# Patient Record
Sex: Female | Born: 1995 | Race: Black or African American | Hispanic: No | Marital: Single | Smoking: Never smoker
Health system: Southern US, Community
[De-identification: ages and names within clinical notes are randomized; demographics above are authoritative.]

## PROBLEM LIST (undated history)

## (undated) DIAGNOSIS — Z789 Other specified health status: Secondary | ICD-10-CM

## (undated) HISTORY — PX: NO PAST SURGERIES: SHX2092

---
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
## 2016-01-28 NOTE — L&D Delivery Note (Signed)
21 y.o. G1P0 at 4123w4d delivered a viable female infant in cephalic, LOA position. no nuchal cord. anterior shoulder delivered with ease. 60 sec delayed cord clamping. Cord clamped x2 and cut. Placenta delivered spontaneously intact, with 3VC. Fundus firm on exam with massage and pitocin. Good hemostasis noted.  Anesthesia: Epidural Laceration: None Suture: No sutures required Good hemostasis noted. EBL: 100 cc  Mom and baby recovering in LDR.    Weight: Pending skin to skin  Sponge and instrument count were correct x2. Placenta sent to L&D.  Howard PouchLauren Feng, MD PGY-1 Family Medicine 06/16/2016, 8:23 PM   OB FELLOW DELIVERY ATTESTATION  I was gloved and present for the delivery in its entirety, and I agree with the above resident's note.    Ernestina PennaNicholas Schenk, MD 8:25 PM

## 2016-03-03 ENCOUNTER — Emergency Department (HOSPITAL_COMMUNITY): Payer: Medicaid Other

## 2016-03-03 ENCOUNTER — Emergency Department (HOSPITAL_COMMUNITY)
Admission: EM | Admit: 2016-03-03 | Discharge: 2016-03-04 | Disposition: A | Discharge status: Home / Self Care | Payer: Medicaid Other | Attending: Emergency Medicine | Admitting: Emergency Medicine

## 2016-03-03 ENCOUNTER — Encounter (HOSPITAL_COMMUNITY): Payer: Self-pay | Admitting: *Deleted

## 2016-03-03 DIAGNOSIS — O368190 Decreased fetal movements, unspecified trimester, not applicable or unspecified: Secondary | ICD-10-CM

## 2016-03-03 DIAGNOSIS — R109 Unspecified abdominal pain: Secondary | ICD-10-CM

## 2016-03-03 DIAGNOSIS — O26892 Other specified pregnancy related conditions, second trimester: Secondary | ICD-10-CM | POA: Diagnosis present

## 2016-03-03 DIAGNOSIS — Z3A26 26 weeks gestation of pregnancy: Secondary | ICD-10-CM | POA: Diagnosis not present

## 2016-03-03 DIAGNOSIS — N898 Other specified noninflammatory disorders of vagina: Secondary | ICD-10-CM | POA: Diagnosis not present

## 2016-03-03 DIAGNOSIS — O26893 Other specified pregnancy related conditions, third trimester: Secondary | ICD-10-CM

## 2016-03-03 LAB — COMPREHENSIVE METABOLIC PANEL
ALBUMIN: 3.4 g/dL — AB (ref 3.5–5.0)
ALK PHOS: 69 U/L (ref 38–126)
ALT: 19 U/L (ref 14–54)
AST: 22 U/L (ref 15–41)
Anion gap: 8 (ref 5–15)
BILIRUBIN TOTAL: 0.5 mg/dL (ref 0.3–1.2)
BUN: 5 mg/dL — AB (ref 6–20)
CO2: 23 mmol/L (ref 22–32)
CREATININE: 0.61 mg/dL (ref 0.44–1.00)
Calcium: 9.4 mg/dL (ref 8.9–10.3)
Chloride: 107 mmol/L (ref 101–111)
GFR calc Af Amer: >60 mL/min (ref >60)
GFR calc non Af Amer: >60 mL/min (ref >60)
GLUCOSE: 103 mg/dL — AB (ref 65–99)
Potassium: 3.7 mmol/L (ref 3.5–5.1)
Sodium: 138 mmol/L (ref 135–145)
TOTAL PROTEIN: 6.8 g/dL (ref 6.5–8.1)

## 2016-03-03 LAB — CBC WITH DIFFERENTIAL/PLATELET
BASOS ABS: 0 10*3/uL (ref 0.0–0.1)
BASOS PCT: 0 %
EOS PCT: 1 %
Eosinophils Absolute: 0.1 10*3/uL (ref 0.0–0.7)
HCT: 34 % — ABNORMAL LOW (ref 36.0–46.0)
Hemoglobin: 11.1 g/dL — ABNORMAL LOW (ref 12.0–15.0)
Lymphocytes Relative: 17 %
Lymphs Abs: 2.5 10*3/uL (ref 0.7–4.0)
MCH: 28 pg (ref 26.0–34.0)
MCHC: 32.6 g/dL (ref 30.0–36.0)
MCV: 85.9 fL (ref 78.0–100.0)
MONO ABS: 0.6 10*3/uL (ref 0.1–1.0)
Monocytes Relative: 4 %
Neutro Abs: 11.5 10*3/uL — ABNORMAL HIGH (ref 1.7–7.7)
Neutrophils Relative %: 78 %
PLATELETS: 439 10*3/uL — AB (ref 150–400)
RBC: 3.96 MIL/uL (ref 3.87–5.11)
RDW: 15.1 % (ref 11.5–15.5)
WBC: 14.7 10*3/uL — ABNORMAL HIGH (ref 4.0–10.5)

## 2016-03-03 LAB — ABO/RH: ABO/RH(D): O POS

## 2016-03-03 LAB — HCG, QUANTITATIVE, PREGNANCY: hCG, Beta Chain, Quant, S: 5664 m[IU]/mL — ABNORMAL HIGH (ref <5)

## 2016-03-03 NOTE — ED Triage Notes (Signed)
Pt reports not seeing MD since she found out she is pregnant,  Pt reports not feeling the baby move in 1 wk, pt LMP 08/31/2016, pt denies vaginal bleeding, pt denies cramping, denies n/v/d, pt A&O x4

## 2016-03-03 NOTE — Progress Notes (Signed)
[facility] to see this patient. She is a G1P0, who presented with no prenatal care, self estimated LMP 08/28/15. She is complaining of some abdominal pain, states she discussed her problems of no prenatal care earlier this evening with nurse on "help line", who recommended she seek immediate care at ED.   2345-Fetal heart tones 155bpm, assessing for contractions. Patient in no appearent distress.

## 2016-03-03 NOTE — ED Provider Notes (Signed)
[facility]-EMERGENCY DEPT Provider Note   CSN: [id number] Arrival date & time: 03/03/16  1538  By signing my name below, I, Talbert Nan, attest that this documentation has been prepared under the direction and in the presence of Kerrie Buffalo, NP. Electronically Signed: Talbert Nan, Scribe. 03/03/16. 11:53 PM.   History   Chief Complaint Chief Complaint  Patient presents with  . Abdominal Pain    HPI Christina Mann is a 21 y.o. female G1P0 who presents to the Emergency Department after talking to a nurse on the phone and discussing prenatal treatment.  She reports that it has been about a week since she felt fetal movement. She has not see an OBGYN. Pt reports vaginal discharge. LMP was 08/28/15. Pt reports nausea, limited appetite, cramping.   The history is provided by the patient. No language interpreter was used.    No past medical history on file.  There are no active problems to display for this patient.   No past surgical history on file.  OB History    Gravida Para Term Preterm AB Living   1             SAB TAB Ectopic Multiple Live Births                   Home Medications    Prior to Admission medications   Not on File    Family History No family history on file.  Social History Social History  Substance Use Topics  . Smoking status: Never Smoker  . Smokeless tobacco: Not on file  . Alcohol use Not on file     Allergies   Patient has no known allergies.   Review of Systems Review of Systems  Constitutional: Positive for appetite change. Negative for chills and fever.  Respiratory: Negative for shortness of breath.   Gastrointestinal: Positive for abdominal pain and nausea. Negative for diarrhea.  Genitourinary: Positive for vaginal discharge.  Musculoskeletal:       Cramping.  Skin: Negative for rash.  Neurological: Negative for syncope.     Physical Exam Updated Vital Signs BP 120/69 (BP Location: Right Arm)   Pulse 90   Temp 98.1 F  (36.7 C) (Oral)   Resp 16   SpO2 100%   Physical Exam  Constitutional: She is oriented to person, place, and time. She appears well-developed and well-nourished. No distress.  HENT:  Head: Normocephalic and atraumatic.  Eyes: EOM are normal.  Neck: Neck supple.  Cardiovascular: Normal rate and regular rhythm.   Pulmonary/Chest: Effort normal and breath sounds normal.  Abdominal: Soft. Bowel sounds are normal.  gravid  Genitourinary:  Genitourinary Comments: External genitalia without lesions, white d/c vaginal vault, cervix closed, thick and high, uterus approximately 26 week size.   Musculoskeletal: Normal range of motion.  Neurological: She is alert and oriented to person, place, and time.  Skin: Skin is warm and dry.  Psychiatric: She has a normal mood and affect. Her behavior is normal.  Nursing note and vitals reviewed.    ED Treatments / Results   DIAGNOSTIC STUDIES: Oxygen Saturation is 99% on room air, normal by my interpretation.    COORDINATION OF CARE: 10:44 PM Discussed treatment plan with pt at bedside and pt agreed to plan  11:23 PM Prenatal labs,appointment with [facility] in order to start prenatal care.  Labs (all labs ordered are listed, but only abnormal results are displayed) Labs Reviewed  WET PREP, GENITAL - Abnormal; Notable for the following:  Result Value   Clue Cells Wet Prep HPF POC PRESENT (*)    WBC, Wet Prep HPF POC FEW (*)    All other components within normal limits  HCG, QUANTITATIVE, PREGNANCY - Abnormal; Notable for the following:    hCG, Beta Chain, Quant, S 5,664 (*)    All other components within normal limits  COMPREHENSIVE METABOLIC PANEL - Abnormal; Notable for the following:    Glucose, Bld 103 (*)    BUN 5 (*)    Albumin 3.4 (*)    All other components within normal limits  CBC WITH DIFFERENTIAL/PLATELET - Abnormal; Notable for the following:    WBC 14.7 (*)    Hemoglobin 11.1 (*)    HCT 34.0 (*)    Platelets  439 (*)    Neutro Abs 11.5 (*)    All other components within normal limits  CULTURE, BETA STREP (GROUP B ONLY)  RPR  HIV ANTIBODY (ROUTINE TESTING)  HEPATITIS B SURFACE ANTIGEN  ABO/RH  GC/CHLAMYDIA PROBE AMP ([place]) NOT AT [facility]   Radiology Ultrasound showed a 26 week 2 day IUP with cardiac activity.  External fetal monitory applied, rapid response RN here to see the patient and review monitor tracing.  Consult with Dr. Erin FullingHarraway-Smith and arrangements made [facility].   Procedures Procedures (including critical care time)  Medications Ordered in ED Medications - No data to display   Initial Impression / Assessment and Plan / ED Course  I have reviewed the triage vital signs and the nursing notes.  Pertinent labs & imaging results that were available during my care of the patient were reviewed by me and considered in my medical decision making (see chart for details).   Final Clinical Impressions(s) / ED Diagnoses  21 y.o. female stable for d/c without acute abdomen and appears comfortable.  Final diagnoses:  Abdominal pain in pregnancy, third trimester    New Prescriptions Discharge Medication List as of 03/04/2016 12:35 AM     I personally performed the services described in this documentation, which was scribed in my presence. The recorded information has been reviewed and is accurate.     [address] DriveHope Miller CityM Khoury Siemon, [place]NP 03/07/16 [phone]    Mancel BaleElliott Wentz, MD 03/07/16 [phone]

## 2016-03-03 NOTE — ED Notes (Signed)
Patient ambulated to the room independently no acute distress noted.

## 2016-03-03 NOTE — ED Notes (Signed)
Corlis LeakMackuen, MD at triage, labs ordered on pt, MD aware of pts complaint, plan of care to have labs ordered & US completed

## 2016-03-04 LAB — WET PREP, GENITAL
SPERM: NONE SEEN
Trich, Wet Prep: NONE SEEN
Yeast Wet Prep HPF POC: NONE SEEN

## 2016-03-04 LAB — GC/CHLAMYDIA PROBE AMP (~~LOC~~) NOT AT ARMC
CHLAMYDIA, DNA PROBE: NEGATIVE
NEISSERIA GONORRHEA: NEGATIVE

## 2016-03-04 LAB — HIV ANTIBODY (ROUTINE TESTING W REFLEX): HIV Screen 4th Generation wRfx: NONREACTIVE

## 2016-03-04 LAB — RPR: RPR: NONREACTIVE

## 2016-03-04 NOTE — Discharge Instructions (Signed)
Take tylenol as needed for pain. [facility] will call [facility] to schedule your follow up appointment.

## 2016-03-04 NOTE — ED Notes (Signed)
Delay in lab draw,  Provider examining pt at this time

## 2016-03-05 LAB — HEPATITIS B SURFACE ANTIGEN: Hepatitis B Surface Ag: NEGATIVE

## 2016-03-11 ENCOUNTER — Encounter: Payer: Medicaid Other | Admitting: Student

## 2016-03-13 ENCOUNTER — Other Ambulatory Visit: Payer: Self-pay | Admitting: Obstetrics and Gynecology

## 2016-03-13 ENCOUNTER — Ambulatory Visit (INDEPENDENT_AMBULATORY_CARE_PROVIDER_SITE_OTHER): Payer: Medicaid Other | Admitting: Obstetrics and Gynecology

## 2016-03-13 DIAGNOSIS — O0933 Supervision of pregnancy with insufficient antenatal care, third trimester: Secondary | ICD-10-CM | POA: Insufficient documentation

## 2016-03-13 DIAGNOSIS — Z3400 Encounter for supervision of normal first pregnancy, unspecified trimester: Secondary | ICD-10-CM | POA: Insufficient documentation

## 2016-03-13 NOTE — Progress Notes (Signed)
  Subjective:    Christina Mann is a G1P0 4466w0d being seen today for her first obstetrical visit.  Her obstetrical history is significant for late onset to care at 27 weeks. Patient does not intend to breast feed. Pregnancy history fully reviewed.  Patient reports no complaints. She plans to give the baby up for adoption and is working with an agency  Vitals:   03/13/16 1001  BP: (!) 105/57  Pulse: (!) 107    HISTORY: OB History  Gravida Para Term Preterm AB Living  1            SAB TAB Ectopic Multiple Live Births               # Outcome Date GA Lbr Len/2nd Weight Sex Delivery Anes PTL Lv  1 Current              No past medical history on file. No past surgical history on file. No family history on file.   Exam    Uterus:   fundal height 27 cm  Pelvic Exam:    Perineum: No Hemorrhoids, Normal Perineum   Vulva: normal   Vagina:  normal mucosa, normal discharge   pH:    Cervix: nulliparous appearance and cervix is closed and long   Adnexa: not evaluated   Bony Pelvis: gynecoid  System: Breast:  normal appearance, no masses or tenderness   Skin: normal coloration and turgor, no rashes    Neurologic: oriented, no focal deficits   Extremities: normal strength, tone, and muscle mass   HEENT extra ocular movement intact   Mouth/Teeth mucous membranes moist, pharynx normal without lesions and dental hygiene good   Neck supple and no masses   Cardiovascular: regular rate and rhythm   Respiratory:  chest clear, no wheezing, crepitations, rhonchi, normal symmetric air entry   Abdomen: soft, non-tender; bowel sounds normal; no masses,  no organomegaly   Urinary:       Assessment:    Pregnancy: G1P0 Patient Active Problem List   Diagnosis Date Noted  . Supervision of normal first pregnancy, antepartum 03/13/2016  . Insufficient prenatal care, third trimester 03/13/2016        Plan:     Initial labs drawn. Prenatal vitamins. Problem list reviewed and  updated. Genetic Screening discussed : too late.  Ultrasound discussed; fetal survey: ordered. Patient declined flu and Tdap vaccine Patient will return before her next appointment for glucola  Follow up in 2 weeks. 50% of 30 min visit spent on counseling and coordination of care.     Katie Moch 03/13/2016

## 2016-03-13 NOTE — Patient Instructions (Addendum)
Third Trimester of Pregnancy The third trimester is from week 29 through week 40 (months 7 through 9). The third trimester is a time when the unborn baby (fetus) is growing rapidly. At the end of the ninth month, the fetus is about 20 inches in length and weighs 6-10 pounds. Body changes during your third trimester Your body goes through many changes during pregnancy. The changes vary from woman to woman. During the third trimester:  Your weight will continue to increase. You can expect to gain 25-35 pounds (11-16 kg) by the end of the pregnancy.  You may begin to get stretch marks on your hips, abdomen, and breasts.  You may urinate more often because the fetus is moving lower into your pelvis and pressing on your bladder.  You may develop or continue to have heartburn. This is caused by increased hormones that slow down muscles in the digestive tract.  You may develop or continue to have constipation because increased hormones slow digestion and cause the muscles that push waste through your intestines to relax.  You may develop hemorrhoids. These are swollen veins (varicose veins) in the rectum that can itch or be painful.  You may develop swollen, bulging veins (varicose veins) in your legs.  You may have increased body aches in the pelvis, back, or thighs. This is due to weight gain and increased hormones that are relaxing your joints.  You may have changes in your hair. These can include thickening of your hair, rapid growth, and changes in texture. Some women also have hair loss during or after pregnancy, or hair that feels dry or thin. Your hair will most likely return to normal after your baby is born.  Your breasts will continue to grow and they will continue to become tender. A yellow fluid (colostrum) may leak from your breasts. This is the first milk you are producing for your baby.  Your belly button may stick out.  You may notice more swelling in your hands, face, or  ankles.  You may have increased tingling or numbness in your hands, arms, and legs. The skin on your belly may also feel numb.  You may feel short of breath because of your expanding uterus.  You may have more problems sleeping. This can be caused by the size of your belly, increased need to urinate, and an increase in your body's metabolism.  You may notice the fetus "dropping," or moving lower in your abdomen.  You may have increased vaginal discharge.  Your cervix becomes thin and soft (effaced) near your due date. What to expect at prenatal visits You will have prenatal exams every 2 weeks until week 36. Then you will have weekly prenatal exams. During a routine prenatal visit:  You will be weighed to make sure you and the fetus are growing normally.  Your blood pressure will be taken.  Your abdomen will be measured to track your baby's growth.  The fetal heartbeat will be listened to.  Any test results from the previous visit will be discussed.  You may have a cervical check near your due date to see if you have effaced. At around 36 weeks, your health care provider will check your cervix. At the same time, your health care provider will also perform a test on the secretions of the vaginal tissue. This test is to determine if a type of bacteria, Group B streptococcus, is present. Your health care provider will explain this further. Your health care provider may ask you:    What your birth plan is.  How you are feeling.  If you are feeling the baby move.  If you have had any abnormal symptoms, such as leaking fluid, bleeding, severe headaches, or abdominal cramping.  If you are using any tobacco products, including cigarettes, chewing tobacco, and electronic cigarettes.  If you have any questions. Other tests or screenings that may be performed during your third trimester include:  Blood tests that check for low iron levels (anemia).  Fetal testing to check the health,  activity level, and growth of the fetus. Testing is done if you have certain medical conditions or if there are problems during the pregnancy.  Nonstress test (NST). This test checks the health of your baby to make sure there are no signs of problems, such as the baby not getting enough oxygen. During this test, a belt is placed around your belly. The baby is made to move, and its heart rate is monitored during movement. What is false labor? False labor is a condition in which you feel small, irregular tightenings of the muscles in the womb (contractions) that eventually go away. These are called Braxton Hicks contractions. Contractions may last for hours, days, or even weeks before true labor sets in. If contractions come at regular intervals, become more frequent, increase in intensity, or become painful, you should see your health care provider. What are the signs of labor?  Abdominal cramps.  Regular contractions that start at 10 minutes apart and become stronger and more frequent with time.  Contractions that start on the top of the uterus and spread down to the lower abdomen and back.  Increased pelvic pressure and dull back pain.  A watery or bloody mucus discharge that comes from the vagina.  Leaking of amniotic fluid. This is also known as your "water breaking." It could be a slow trickle or a gush. Let your doctor know if it has a color or strange odor. If you have any of these signs, call your health care provider right away, even if it is before your due date. Follow these instructions at home: Eating and drinking  Continue to eat regular, healthy meals.  Do not eat:  Raw meat or meat spreads.  Unpasteurized milk or cheese.  Unpasteurized juice.  Store-made salad.  Refrigerated smoked seafood.  Hot dogs or deli meat, unless they are piping hot.  More than 6 ounces of albacore tuna a week.  Shark, swordfish, king mackerel, or tile fish.  Store-made salads.  Raw  sprouts, such as mung bean or alfalfa sprouts.  Take prenatal vitamins as told by your health care provider.  Take 1000 mg of calcium daily as told by your health care provider.  If you develop constipation:  Take over-the-counter or prescription medicines.  Drink enough fluid to keep your urine clear or pale yellow.  Eat foods that are high in fiber, such as fresh fruits and vegetables, whole grains, and beans.  Limit foods that are high in fat and processed sugars, such as fried and sweet foods. Activity  Exercise only as directed by your health care provider. Healthy pregnant women should aim for 2 hours and 30 minutes of moderate exercise per week. If you experience any pain or discomfort while exercising, stop.  Avoid heavy lifting.  Do not exercise in extreme heat or humidity, or at high altitudes.  Wear low-heel, comfortable shoes.  Practice good posture.  Do not travel far distances unless it is absolutely necessary and only with the approval   of your health care provider.  Wear your seat belt at all times while in a car, on a bus, or on a plane.  Take frequent breaks and rest with your legs elevated if you have leg cramps or low back pain.  Do not use hot tubs, steam rooms, or saunas.  You may continue to have sex unless your health care provider tells you otherwise. Lifestyle  Do not use any products that contain nicotine or tobacco, such as cigarettes and e-cigarettes. If you need help quitting, ask your health care provider.  Do not drink alcohol.  Do not use any medicinal herbs or unprescribed drugs. These chemicals affect the formation and growth of the baby.  If you develop varicose veins:  Wear support pantyhose or compression stockings as told by your healthcare provider.  Elevate your feet for 15 minutes, 3-4 times a day.  Wear a supportive maternity bra to help with breast tenderness. General instructions  Take over-the-counter and prescription  medicines only as told by your health care provider. There are medicines that are either safe or unsafe to take during pregnancy.  Take warm sitz baths to soothe any pain or discomfort caused by hemorrhoids. Use hemorrhoid cream or witch hazel if your health care provider approves.  Avoid cat litter boxes and soil used by cats. These carry germs that can cause birth defects in the baby. If you have a cat, ask someone to clean the litter box for you.  To prepare for the arrival of your baby:  Take prenatal classes to understand, practice, and ask questions about the labor and delivery.  Make a trial run to the hospital.  Visit the hospital and tour the maternity area.  Arrange for maternity or paternity leave through employers.  Arrange for family and friends to take care of pets while you are in the hospital.  Purchase a rear-facing car seat and make sure you know how to install it in your car.  Pack your hospital bag.  Prepare the baby's nursery. Make sure to remove all pillows and stuffed animals from the baby's crib to prevent suffocation.  Visit your dentist if you have not gone during your pregnancy. Use a soft toothbrush to brush your teeth and be gentle when you floss.  Keep all prenatal follow-up visits as told by your health care provider. This is important. Contact a health care provider if:  You are unsure if you are in labor or if your water has broken.  You become dizzy.  You have mild pelvic cramps, pelvic pressure, or nagging pain in your abdominal area.  You have lower back pain.  You have persistent nausea, vomiting, or diarrhea.  You have an unusual or bad smelling vaginal discharge.  You have pain when you urinate. Get help right away if:  You have a fever.  You are leaking fluid from your vagina.  You have spotting or bleeding from your vagina.  You have severe abdominal pain or cramping.  You have rapid weight loss or weight gain.  You have  shortness of breath with chest pain.  You notice sudden or extreme swelling of your face, hands, ankles, feet, or legs.  Your baby makes fewer than 10 movements in 2 hours.  You have severe headaches that do not go away with medicine.  You have vision changes. Summary  The third trimester is from week 29 through week 40, months 7 through 9. The third trimester is a time when the unborn baby (fetus)   is growing rapidly.  During the third trimester, your discomfort may increase as you and your baby continue to gain weight. You may have abdominal, leg, and back pain, sleeping problems, and an increased need to urinate.  During the third trimester your breasts will keep growing and they will continue to become tender. A yellow fluid (colostrum) may leak from your breasts. This is the first milk you are producing for your baby.  False labor is a condition in which you feel small, irregular tightenings of the muscles in the womb (contractions) that eventually go away. These are called Braxton Hicks contractions. Contractions may last for hours, days, or even weeks before true labor sets in.  Signs of labor can include: abdominal cramps; regular contractions that start at 10 minutes apart and become stronger and more frequent with time; watery or bloody mucus discharge that comes from the vagina; increased pelvic pressure and dull back pain; and leaking of amniotic fluid. This information is not intended to replace advice given to you by your health care provider. Make sure you discuss any questions you have with your health care provider. Document Released: 01/07/2001 Document Revised: 06/21/2015 Document Reviewed: 03/16/2012 Else[employer] Interactive Patient Education  2017 [employer]  Contraception Choices Contraception (birth control) is the use of any methods or devices to prevent pregnancy. Below are some methods to help avoid pregnancy. Hormonal methods  Contraceptive implant. This is a  thin, plastic tube containing progesterone hormone. It does not contain estrogen hormone. Your health care provider inserts the tube in the inner part of the upper arm. The tube can remain in place for up to 3 years. After 3 years, the implant must be removed. The implant prevents the ovaries from releasing an egg (ovulation), thickens the cervical mucus to prevent sperm from entering the uterus, and thins the lining of the inside of the uterus.  Progesterone-only injections. These injections are given every 3 months by your health care provider to prevent pregnancy. This synthetic progesterone hormone stops the ovaries from releasing eggs. It also thickens cervical mucus and changes the uterine lining. This makes it harder for sperm to survive in the uterus.  Birth control pills. These pills contain estrogen and progesterone hormone. They work by preventing the ovaries from releasing eggs (ovulation). They also cause the cervical mucus to thicken, preventing the sperm from entering the uterus. Birth control pills are prescribed by a health care provider.Birth control pills can also be used to treat heavy periods.  Minipill. This type of birth control pill contains only the progesterone hormone. They are taken every day of each month and must be prescribed by your health care provider.  Birth control patch. The patch contains hormones similar to those in birth control pills. It must be changed once a week and is prescribed by a health care provider.  Vaginal ring. The ring contains hormones similar to those in birth control pills. It is left in the vagina for 3 weeks, removed for 1 week, and then a new one is put back in place. The patient must be comfortable inserting and removing the ring from the vagina.A health care provider's prescription is necessary.  Emergency contraception. Emergency contraceptives prevent pregnancy after unprotected sexual intercourse. This pill can be taken right after sex  or up to 5 days after unprotected sex. It is most effective the sooner you take the pills after having sexual intercourse. Most emergency contraceptive pills are available without a prescription. Check with your pharmacist. Do not use   emergency contraception as your only form of birth control. Barrier methods  Female condom. This is a thin sheath (latex or rubber) that is worn over the penis during sexual intercourse. It can be used with spermicide to increase effectiveness.  Female condom. This is a soft, loose-fitting sheath that is put into the vagina before sexual intercourse.  Diaphragm. This is a soft, latex, dome-shaped barrier that must be fitted by a health care provider. It is inserted into the vagina, along with a spermicidal jelly. It is inserted before intercourse. The diaphragm should be left in the vagina for 6 to 8 hours after intercourse.  Cervical cap. This is a round, soft, latex or plastic cup that fits over the cervix and must be fitted by a health care provider. The cap can be left in place for up to 48 hours after intercourse.  Sponge. This is a soft, circular piece of polyurethane foam. The sponge has spermicide in it. It is inserted into the vagina after wetting it and before sexual intercourse.  Spermicides. These are chemicals that kill or block sperm from entering the cervix and uterus. They come in the form of creams, jellies, suppositories, foam, or tablets. They do not require a prescription. They are inserted into the vagina with an applicator before having sexual intercourse. The process must be repeated every time you have sexual intercourse. Intrauterine contraception  Intrauterine device (IUD). This is a T-shaped device that is put in a woman's uterus during a menstrual period to prevent pregnancy. There are 2 types:  Copper IUD. This type of IUD is wrapped in copper wire and is placed inside the uterus. Copper makes the uterus and fallopian tubes produce a fluid  that kills sperm. It can stay in place for 10 years.  Hormone IUD. This type of IUD contains the hormone progestin (synthetic progesterone). The hormone thickens the cervical mucus and prevents sperm from entering the uterus, and it also thins the uterine lining to prevent implantation of a fertilized egg. The hormone can weaken or kill the sperm that get into the uterus. It can stay in place for 3-5 years, depending on which type of IUD is used. Permanent methods of contraception  Female tubal ligation. This is when the woman's fallopian tubes are surgically sealed, tied, or blocked to prevent the egg from traveling to the uterus.  Hysteroscopic sterilization. This involves placing a small coil or insert into each fallopian tube. Your doctor uses a technique called hysteroscopy to do the procedure. The device causes scar tissue to form. This results in permanent blockage of the fallopian tubes, so the sperm cannot fertilize the egg. It takes about 3 months after the procedure for the tubes to become blocked. You must use another form of birth control for these 3 months.  Female sterilization. This is when the female has the tubes that carry sperm tied off (vasectomy).This blocks sperm from entering the vagina during sexual intercourse. After the procedure, the man can still ejaculate fluid (semen). Natural planning methods  Natural family planning. This is not having sexual intercourse or using a barrier method (condom, diaphragm, cervical cap) on days the woman could become pregnant.  Calendar method. This is keeping track of the length of each menstrual cycle and identifying when you are fertile.  Ovulation method. This is avoiding sexual intercourse during ovulation.  Symptothermal method. This is avoiding sexual intercourse during ovulation, using a thermometer and ovulation symptoms.  Post-ovulation method. This is timing sexual intercourse after you have   ovulated. Regardless of which type or  method of contraception you choose, it is important that you use condoms to protect against the transmission of sexually transmitted infections (STIs). Talk with your health care provider about which form of contraception is most appropriate for you. This information is not intended to replace advice given to you by your health care provider. Make sure you discuss any questions you have with your health care provider. Document Released: 01/13/2005 Document Revised: 06/21/2015 Document Reviewed: 07/08/2012 Else[employer] Interactive Patient Education  2017 [employer]   Breastfeeding Deciding to breastfeed is one of the best choices you can make for you and your baby. A change in hormones during pregnancy causes your breast tissue to grow and increases the number and size of your milk ducts. These hormones also allow proteins, sugars, and fats from your blood supply to make breast milk in your milk-producing glands. Hormones prevent breast milk from being released before your baby is born as well as prompt milk flow after birth. Once breastfeeding has begun, thoughts of your baby, as well as his or her sucking or crying, can stimulate the release of milk from your milk-producing glands. Benefits of breastfeeding For Your Baby  Your first milk (colostrum) helps your baby's digestive system function better.  There are antibodies in your milk that help your baby fight off infections.  Your baby has a lower incidence of asthma, allergies, and sudden infant death syndrome.  The nutrients in breast milk are better for your baby than infant formulas and are designed uniquely for your baby's needs.  Breast milk improves your baby's brain development.  Your baby is less likely to develop other conditions, such as childhood obesity, asthma, or type 2 diabetes mellitus. For You  Breastfeeding helps to create a very special bond between you and your baby.  Breastfeeding is convenient. Breast milk is always  available at the correct temperature and costs nothing.  Breastfeeding helps to burn calories and helps you lose the weight gained during pregnancy.  Breastfeeding makes your uterus contract to its prepregnancy size faster and slows bleeding (lochia) after you give birth.  Breastfeeding helps to lower your risk of developing type 2 diabetes mellitus, osteoporosis, and breast or ovarian cancer later in life. Signs that your baby is hungry Early Signs of Hunger  Increased alertness or activity.  Stretching.  Movement of the head from side to side.  Movement of the head and opening of the mouth when the corner of the mouth or cheek is stroked (rooting).  Increased sucking sounds, smacking lips, cooing, sighing, or squeaking.  Hand-to-mouth movements.  Increased sucking of fingers or hands. Late Signs of Hunger  Fussing.  Intermittent crying. Extreme Signs of Hunger  Signs of extreme hunger will require calming and consoling before your baby will be able to breastfeed successfully. Do not wait for the following signs of extreme hunger to occur before you initiate breastfeeding:  Restlessness.  A loud, strong cry.  Screaming. Breastfeeding basics  Breastfeeding Initiation  Find a comfortable place to sit or lie down, with your neck and back well supported.  Place a pillow or rolled up blanket under your baby to bring him or her to the level of your breast (if you are seated). Nursing pillows are specially designed to help support your arms and your baby while you breastfeed.  Make sure that your baby's abdomen is facing your abdomen.  Gently massage your breast. With your fingertips, massage from your chest wall toward your   nipple in a circular motion. This encourages milk flow. You may need to continue this action during the feeding if your milk flows slowly.  Support your breast with 4 fingers underneath and your thumb above your nipple. Make sure your fingers are well  away from your nipple and your baby's mouth.  Stroke your baby's lips gently with your finger or nipple.  When your baby's mouth is open wide enough, quickly bring your baby to your breast, placing your entire nipple and as much of the colored area around your nipple (areola) as possible into your baby's mouth.  More areola should be visible above your baby's upper lip than below the lower lip.  Your baby's tongue should be between his or her lower gum and your breast.  Ensure that your baby's mouth is correctly positioned around your nipple (latched). Your baby's lips should create a seal on your breast and be turned out (everted).  It is common for your baby to suck about 2-3 minutes in order to start the flow of breast milk. Latching  Teaching your baby how to latch on to your breast properly is very important. An improper latch can cause nipple pain and decreased milk supply for you and poor weight gain in your baby. Also, if your baby is not latched onto your nipple properly, he or she may swallow some air during feeding. This can make your baby fussy. Burping your baby when you switch breasts during the feeding can help to get rid of the air. However, teaching your baby to latch on properly is still the best way to prevent fussiness from swallowing air while breastfeeding. Signs that your baby has successfully latched on to your nipple:  Silent tugging or silent sucking, without causing you pain.  Swallowing heard between every 3-4 sucks.  Muscle movement above and in front of his or her ears while sucking. Signs that your baby has not successfully latched on to nipple:  Sucking sounds or smacking sounds from your baby while breastfeeding.  Nipple pain. If you think your baby has not latched on correctly, slip your finger into the corner of your baby's mouth to break the suction and place it between your baby's gums. Attempt breastfeeding initiation again. Signs of Successful  Breastfeeding  Signs from your baby:  A gradual decrease in the number of sucks or complete cessation of sucking.  Falling asleep.  Relaxation of his or her body.  Retention of a small amount of milk in his or her mouth.  Letting go of your breast by himself or herself. Signs from you:  Breasts that have increased in firmness, weight, and size 1-3 hours after feeding.  Breasts that are softer immediately after breastfeeding.  Increased milk volume, as well as a change in milk consistency and color by the fifth day of breastfeeding.  Nipples that are not sore, cracked, or bleeding. Signs That Your Baby is Getting Enough Milk  Wetting at least 1-2 diapers during the first 24 hours after birth.  Wetting at least 5-6 diapers every 24 hours for the first week after birth. The urine should be clear or pale yellow by 5 days after birth.  Wetting 6-8 diapers every 24 hours as your baby continues to grow and develop.  At least 3 stools in a 24-hour period by age 5 days. The stool should be soft and yellow.  At least 3 stools in a 24-hour period by age 7 days. The stool should be seedy and yellow.    No loss of weight greater than 10% of birth weight during the first 3 days of age.  Average weight gain of 4-7 ounces (113-198 g) per week after age 4 days.  Consistent daily weight gain by age 5 days, without weight loss after the age of 2 weeks. After a feeding, your baby may spit up a small amount. This is common. Breastfeeding frequency and duration Frequent feeding will help you make more milk and can prevent sore nipples and breast engorgement. Breastfeed when you feel the need to reduce the fullness of your breasts or when your baby shows signs of hunger. This is called "breastfeeding on demand." Avoid introducing a pacifier to your baby while you are working to establish breastfeeding (the first 4-6 weeks after your baby is born). After this time you may choose to use a pacifier.  Research has shown that pacifier use during the first year of a baby's life decreases the risk of sudden infant death syndrome (SIDS). Allow your baby to feed on each breast as long as he or she wants. Breastfeed until your baby is finished feeding. When your baby unlatches or falls asleep while feeding from the first breast, offer the second breast. Because newborns are often sleepy in the first few weeks of life, you may need to awaken your baby to get him or her to feed. Breastfeeding times will vary from baby to baby. However, the following rules can serve as a guide to help you ensure that your baby is properly fed:  Newborns (babies 4 weeks of age or younger) may breastfeed every 1-3 hours.  Newborns should not go longer than 3 hours during the day or 5 hours during the night without breastfeeding.  You should breastfeed your baby a minimum of 8 times in a 24-hour period until you begin to introduce solid foods to your baby at around 6 months of age. Breast milk pumping Pumping and storing breast milk allows you to ensure that your baby is exclusively fed your breast milk, even at times when you are unable to breastfeed. This is especially important if you are going back to work while you are still breastfeeding or when you are not able to be present during feedings. Your lactation consultant can give you guidelines on how long it is safe to store breast milk. A breast pump is a machine that allows you to pump milk from your breast into a sterile bottle. The pumped breast milk can then be stored in a refrigerator or freezer. Some breast pumps are operated by hand, while others use electricity. Ask your lactation consultant which type will work best for you. Breast pumps can be purchased, but some hospitals and breastfeeding support groups lease breast pumps on a monthly basis. A lactation consultant can teach you how to hand express breast milk, if you prefer not to use a pump. Caring for your  breasts while you breastfeed Nipples can become dry, cracked, and sore while breastfeeding. The following recommendations can help keep your breasts moisturized and healthy:  Avoid using soap on your nipples.  Wear a supportive bra. Although not required, special nursing bras and tank tops are designed to allow access to your breasts for breastfeeding without taking off your entire bra or top. Avoid wearing underwire-style bras or extremely tight bras.  Air dry your nipples for 3-4minutes after each feeding.  Use only cotton bra pads to absorb leaked breast milk. Leaking of breast milk between feedings is normal.  Use lanolin on   your nipples after breastfeeding. Lanolin helps to maintain your skin's normal moisture barrier. If you use pure lanolin, you do not need to wash it off before feeding your baby again. Pure lanolin is not toxic to your baby. You may also hand express a few drops of breast milk and gently massage that milk into your nipples and allow the milk to air dry. In the first few weeks after giving birth, some women experience extremely full breasts (engorgement). Engorgement can make your breasts feel heavy, warm, and tender to the touch. Engorgement peaks within 3-5 days after you give birth. The following recommendations can help ease engorgement:  Completely empty your breasts while breastfeeding or pumping. You may want to start by applying warm, moist heat (in the shower or with warm water-soaked hand towels) just before feeding or pumping. This increases circulation and helps the milk flow. If your baby does not completely empty your breasts while breastfeeding, pump any extra milk after he or she is finished.  Wear a snug bra (nursing or regular) or tank top for 1-2 days to signal your body to slightly decrease milk production.  Apply ice packs to your breasts, unless this is too uncomfortable for you.  Make sure that your baby is latched on and positioned properly while  breastfeeding. If engorgement persists after 48 hours of following these recommendations, contact your health care provider or a lactation consultant. Overall health care recommendations while breastfeeding  Eat healthy foods. Alternate between meals and snacks, eating 3 of each per day. Because what you eat affects your breast milk, some of the foods may make your baby more irritable than usual. Avoid eating these foods if you are sure that they are negatively affecting your baby.  Drink milk, fruit juice, and water to satisfy your thirst (about 10 glasses a day).  Rest often, relax, and continue to take your prenatal vitamins to prevent fatigue, stress, and anemia.  Continue breast self-awareness checks.  Avoid chewing and smoking tobacco. Chemicals from cigarettes that pass into breast milk and exposure to secondhand smoke may harm your baby.  Avoid alcohol and drug use, including marijuana. Some medicines that may be harmful to your baby can pass through breast milk. It is important to ask your health care provider before taking any medicine, including all over-the-counter and prescription medicine as well as vitamin and herbal supplements. It is possible to become pregnant while breastfeeding. If birth control is desired, ask your health care provider about options that will be safe for your baby. Contact a health care provider if:  You feel like you want to stop breastfeeding or have become frustrated with breastfeeding.  You have painful breasts or nipples.  Your nipples are cracked or bleeding.  Your breasts are red, tender, or warm.  You have a swollen area on either breast.  You have a fever or chills.  You have nausea or vomiting.  You have drainage other than breast milk from your nipples.  Your breasts do not become full before feedings by the fifth day after you give birth.  You feel sad and depressed.  Your baby is too sleepy to eat well.  Your baby is having  trouble sleeping.  Your baby is wetting less than 3 diapers in a 24-hour period.  Your baby has less than 3 stools in a 24-hour period.  Your baby's skin or the white part of his or her eyes becomes yellow.  Your baby is not gaining weight by 5 days   of age. Get help right away if:  Your baby is overly tired (lethargic) and does not want to wake up and feed.  Your baby develops an unexplained fever. This information is not intended to replace advice given to you by your health care provider. Make sure you discuss any questions you have with your health care provider. Document Released: 01/13/2005 Document Revised: 06/27/2015 Document Reviewed: 07/07/2012 Elsevier Interactive Patient Education  2017 [employer] Inc[employer]   Preterm Labor and Birth Information The normal length of a pregnancy is 39-41 weeks. Preterm labor is when labor starts before 37 completed weeks of pregnancy. What are the risk factors for preterm labor? Preterm labor is more likely to occur in women who:  Have certain infections during pregnancy such as a bladder infection, sexually transmitted infection, or infection inside the uterus (chorioamnionitis).  Have a shorter-than-normal cervix.  Have gone into preterm labor before.  Have had surgery on their cervix.  Are younger than age 17 or older than age 35.  Are African American.  Are pregnant with twins or multiple babies (multiple gestation).  Take street drugs or smoke while pregnant.  Do not gain enough weight while pregnant.  Became pregnant shortly after having been pregnant. What are the symptoms of preterm labor? Symptoms of preterm labor include:  Cramps similar to those that can happen during a menstrual period. The cramps may happen with diarrhea.  Pain in the abdomen or lower back.  Regular uterine contractions that may feel like tightening of the abdomen.  A feeling of increased pressure in the pelvis.  Increased watery or bloody mucus  discharge from the vagina.  Water breaking (ruptured amniotic sac). Why is it important to recognize signs of preterm labor? It is important to recognize signs of preterm labor because babies who are born prematurely may not be fully developed. This can put them at an increased risk for:  Long-term (chronic) heart and lung problems.  Difficulty immediately after birth with regulating body systems, including blood sugar, body temperature, heart rate, and breathing rate.  Bleeding in the brain.  Cerebral palsy.  Learning difficulties.  Death. These risks are highest for babies who are born before 34 weeks of pregnancy. How is preterm labor treated? Treatment depends on the length of your pregnancy, your condition, and the health of your baby. It may involve:  Having a stitch (suture) placed in your cervix to prevent your cervix from opening too early (cerclage).  Taking or being given medicines, such as:  Hormone medicines. These may be given early in pregnancy to help support the pregnancy.  Medicine to stop contractions.  Medicines to help mature the baby's lungs. These may be prescribed if the risk of delivery is high.  Medicines to prevent your baby from developing cerebral palsy. If the labor happens before 34 weeks of pregnancy, you may need to stay in the hospital. What should I do if I think I am in preterm labor? If you think that you are going into preterm labor, call your health care provider right away. How can I prevent preterm labor in future pregnancies? To increase your chance of having a full-term pregnancy:  Do not use any tobacco products, such as cigarettes, chewing tobacco, and e-cigarettes. If you need help quitting, ask your health care provider.  Do not use street drugs or medicines that have not been prescribed to you during your pregnancy.  Talk with your health care provider before taking any herbal supplements, even if you have been   taking them  regularly.  Make sure you gain a healthy amount of weight during your pregnancy.  Watch for infection. If you think that you might have an infection, get it checked right away.  Make sure to tell your health care provider if you have gone into preterm labor before. This information is not intended to replace advice given to you by your health care provider. Make sure you discuss any questions you have with your health care provider. Document Released: 04/05/2003 Document Revised: 06/26/2015 Document Reviewed: 06/06/2015 Elsevier Interactive Patient Education  2017 [employer] Inc.  

## 2016-03-13 NOTE — Addendum Note (Signed)
Addended by: Catalina AntiguaONSTANT, Byren Pankow on: 03/13/2016 10:26 AM   Modules accepted: Orders

## 2016-03-13 NOTE — Addendum Note (Signed)
Addended by: Faythe CasaBELLAMY, Edgar Corrigan M on: 03/13/2016 10:42 AM   Modules accepted: Orders

## 2016-03-13 NOTE — Progress Notes (Signed)
New ob packet given  Declines flu vaccine  Anatomy US scheduled for February 21st @ 1345.

## 2016-03-13 NOTE — Addendum Note (Signed)
Addended by: Faythe CasaBELLAMY, Lucious Zou M on: 03/13/2016 01:58 PM   Modules accepted: Orders

## 2016-03-14 ENCOUNTER — Encounter (HOSPITAL_COMMUNITY): Payer: Self-pay | Admitting: Obstetrics and Gynecology

## 2016-03-14 LAB — PAIN MGMT, PROFILE 6 CONF W/O MM, U
6 Acetylmorphine: NEGATIVE ng/mL (ref <10)
ALCOHOL METABOLITES: NEGATIVE ng/mL (ref <500)
Amphetamines: NEGATIVE ng/mL (ref <500)
BENZODIAZEPINES: NEGATIVE ng/mL (ref <100)
Barbiturates: NEGATIVE ng/mL (ref <300)
COCAINE METABOLITE: NEGATIVE ng/mL (ref <150)
MARIJUANA METABOLITE: NEGATIVE ng/mL (ref <20)
Methadone Metabolite: NEGATIVE ng/mL (ref <100)
OPIATES: NEGATIVE ng/mL (ref <100)
OXYCODONE: NEGATIVE ng/mL (ref <100)
Oxidant: NEGATIVE ug/mL (ref <200)
PLEASE NOTE: 0
Phencyclidine: NEGATIVE ng/mL (ref <25)
pH: 7.63 (ref 4.5–9.0)

## 2016-03-14 LAB — PRENATAL PROFILE (SOLSTAS)
Antibody Screen: NEGATIVE
Basophils Absolute: 0 cells/uL (ref 0–200)
Basophils Relative: 0 %
EOS PCT: 1 %
Eosinophils Absolute: 122 cells/uL (ref 15–500)
HEMATOCRIT: 33 % — AB (ref 35.0–45.0)
HEMOGLOBIN: 10.7 g/dL — AB (ref 11.7–15.5)
HEP B S AG: NEGATIVE
HIV: NONREACTIVE
Lymphocytes Relative: 15 %
Lymphs Abs: 1830 cells/uL (ref 850–3900)
MCH: 27.9 pg (ref 27.0–33.0)
MCHC: 32.4 g/dL (ref 32.0–36.0)
MCV: 85.9 fL (ref 80.0–100.0)
MONO ABS: 610 {cells}/uL (ref 200–950)
MPV: 10.3 fL (ref 7.5–12.5)
Monocytes Relative: 5 %
NEUTROS ABS: 9638 {cells}/uL — AB (ref 1500–7800)
NEUTROS PCT: 79 %
Platelets: 406 10*3/uL — ABNORMAL HIGH (ref 140–400)
RBC: 3.84 MIL/uL (ref 3.80–5.10)
RDW: 15.8 % — AB (ref 11.0–15.0)
Rh Type: POSITIVE
Rubella: 16.5 Index — ABNORMAL HIGH (ref <0.90)
WBC: 12.2 10*3/uL — AB (ref 3.8–10.8)

## 2016-03-15 ENCOUNTER — Other Ambulatory Visit: Payer: Self-pay | Admitting: Obstetrics and Gynecology

## 2016-03-15 MED ORDER — PREPLUS 27-1 MG PO TABS: 1.0000 | ORAL_TABLET | Freq: Every day | ORAL | 13 refills | Status: DC

## 2016-03-15 MED ORDER — CEPHALEXIN 500 MG PO CAPS: 500.0000 mg | ORAL_CAPSULE | Freq: Four times a day (QID) | ORAL | 0 refills | Status: DC

## 2016-03-16 LAB — CULTURE, OB URINE

## 2016-03-18 ENCOUNTER — Telehealth: Payer: Self-pay | Admitting: General Practice

## 2016-03-18 ENCOUNTER — Other Ambulatory Visit: Payer: Medicaid Other

## 2016-03-18 NOTE — Telephone Encounter (Signed)
Per Dr Jolayne Pantheronstant, patient has UTI & keflex has been sent to pharmacy. Called patient, no answer- left message stating we are calling to inform you that your recent urine culture shows you have a urinary tract infection & an antibiotic has been sent to your pharmacy. Please go by and pick it up. You may call us back if you have questions

## 2016-03-19 ENCOUNTER — Ambulatory Visit (HOSPITAL_COMMUNITY)
Admission: RE | Admit: 2016-03-19 | Discharge: 2016-03-19 | Disposition: A | Discharge status: Home / Self Care | Payer: Medicaid Other | Source: Ambulatory Visit | Attending: Obstetrics and Gynecology | Admitting: Obstetrics and Gynecology

## 2016-03-19 ENCOUNTER — Other Ambulatory Visit: Payer: Self-pay | Admitting: Obstetrics and Gynecology

## 2016-03-19 DIAGNOSIS — Z3689 Encounter for other specified antenatal screening: Secondary | ICD-10-CM

## 2016-03-19 DIAGNOSIS — Z3A27 27 weeks gestation of pregnancy: Secondary | ICD-10-CM

## 2016-03-19 DIAGNOSIS — O0932 Supervision of pregnancy with insufficient antenatal care, second trimester: Secondary | ICD-10-CM | POA: Insufficient documentation

## 2016-03-19 DIAGNOSIS — Z3400 Encounter for supervision of normal first pregnancy, unspecified trimester: Secondary | ICD-10-CM

## 2016-03-19 DIAGNOSIS — Z363 Encounter for antenatal screening for malformations: Secondary | ICD-10-CM | POA: Diagnosis present

## 2016-03-25 LAB — CFVANTAGE CYSTIC FIB EXP SCREEN

## 2016-03-27 ENCOUNTER — Encounter: Payer: Medicaid Other | Admitting: Obstetrics and Gynecology

## 2016-03-27 ENCOUNTER — Telehealth: Payer: Self-pay | Admitting: Obstetrics and Gynecology

## 2016-03-27 NOTE — Telephone Encounter (Signed)
Called patient due to missed follow-up appointment. Rescheduled and left message with new appointment.

## 2016-03-31 ENCOUNTER — Encounter: Payer: Medicaid Other | Admitting: Medical

## 2016-04-09 ENCOUNTER — Ambulatory Visit (INDEPENDENT_AMBULATORY_CARE_PROVIDER_SITE_OTHER): Payer: Medicaid Other | Admitting: Medical

## 2016-04-09 VITALS — BP 131/87 | HR 91 | Wt 255.0 lb

## 2016-04-09 DIAGNOSIS — Z3490 Encounter for supervision of normal pregnancy, unspecified, unspecified trimester: Secondary | ICD-10-CM

## 2016-04-09 DIAGNOSIS — Z3403 Encounter for supervision of normal first pregnancy, third trimester: Secondary | ICD-10-CM

## 2016-04-09 NOTE — Progress Notes (Signed)
   PRENATAL VISIT NOTE  Subjective:  Christina Mann is a 21 y.o. G1P0 at 2812w6d being seen today for ongoing prenatal care.  She is currently monitored for the following issues for this high-risk pregnancy and has Supervision of normal first pregnancy, antepartum and Insufficient prenatal care, third trimester on her problem list.  Patient reports no complaints.  Contractions: Not present. Vag. Bleeding: None.  Movement: Present. Denies leaking of fluid.   The following portions of the patient's history were reviewed and updated as appropriate: allergies, current medications, past family history, past medical history, past social history, past surgical history and problem list. Problem list updated.  Objective:   Vitals:   04/09/16 1545  BP: 131/87  Pulse: 91  Weight: 255 lb (115.7 kg)    Fetal Status: Fetal Heart Rate (bpm): 141 Fundal Height: 30 cm Movement: Present     General:  Alert, oriented and cooperative. Patient is in no acute distress.  Skin: Skin is warm and dry. No rash noted.   Cardiovascular: Normal heart rate noted  Respiratory: Normal respiratory effort, no problems with respiration noted  Abdomen: Soft, gravid, appropriate for gestational age. Pain/Pressure: Absent     Pelvic:  Cervical exam deferred        Extremities: Normal range of motion.  Edema: None  Mental Status: Normal mood and affect. Normal behavior. Normal judgment and thought content.   Assessment and Plan:  Pregnancy: G1P0 at 3912w6d  1. Encounter for supervision of low-risk pregnancy, antepartum - Glucose Tolerance, 1 Hour - RPR - CBC - HIV antibody (with reflex)  2. Insufficient prenatal care - Patient has missed numerous appointments  Preterm labor symptoms and general obstetric precautions including but not limited to vaginal bleeding, contractions, leaking of fluid and fetal movement were reviewed in detail with the patient. Please refer to After Visit Summary for other counseling  recommendations.  Return in about 2 weeks (around 04/23/2016) for LOB.   Marny LowensteinJulie N Wenzel, PA-C

## 2016-04-09 NOTE — Patient Instructions (Signed)
Fetal Movement Counts Patient Name: ________________________________________________ Patient Due Date: ____________________ What is a fetal movement count? A fetal movement count is the number of times that you feel your baby move during a certain amount of time. This may also be called a fetal kick count. A fetal movement count is recommended for every pregnant woman. You may be asked to start counting fetal movements as early as week 28 of your pregnancy. Pay attention to when your baby is most active. You may notice your baby's sleep and wake cycles. You may also notice things that make your baby move more. You should do a fetal movement count:  When your baby is normally most active.  At the same time each day. A good time to count movements is while you are resting, after having something to eat and drink. How do I count fetal movements? 1. Find a quiet, comfortable area. Sit, or lie down on your side. 2. Write down the date, the start time and stop time, and the number of movements that you felt between those two times. Take this information with you to your health care visits. 3. For 2 hours, count kicks, flutters, swishes, rolls, and jabs. You should feel at least 10 movements during 2 hours. 4. You may stop counting after you have felt 10 movements. 5. If you do not feel 10 movements in 2 hours, have something to eat and drink. Then, keep resting and counting for 1 hour. If you feel at least 4 movements during that hour, you may stop counting. Contact a health care provider if:  You feel fewer than 4 movements in 2 hours.  Your baby is not moving like he or she usually does. Date: ____________ Start time: ____________ Stop time: ____________ Movements: ____________ Date: ____________ Start time: ____________ Stop time: ____________ Movements: ____________ Date: ____________ Start time: ____________ Stop time: ____________ Movements: ____________ Date: ____________ Start time:  ____________ Stop time: ____________ Movements: ____________ Date: ____________ Start time: ____________ Stop time: ____________ Movements: ____________ Date: ____________ Start time: ____________ Stop time: ____________ Movements: ____________ Date: ____________ Start time: ____________ Stop time: ____________ Movements: ____________ Date: ____________ Start time: ____________ Stop time: ____________ Movements: ____________ Date: ____________ Start time: ____________ Stop time: ____________ Movements: ____________ This information is not intended to replace advice given to you by your health care provider. Make sure you discuss any questions you have with your health care provider. Document Released: 02/12/2006 Document Revised: 09/12/2015 Document Reviewed: 02/22/2015 Elsevier Interactive Patient Education  2017 [employer] Braxton Hicks Contractions Contractions of the uterus can occur throughout pregnancy, but they are not always a sign that you are in labor. You may have practice contractions called Braxton Hicks contractions. These false labor contractions are sometimes confused with true labor. What are Braxton Hicks contractions? Braxton Hicks contractions are tightening movements that occur in the muscles of the uterus before labor. Unlike true labor contractions, these contractions do not result in opening (dilation) and thinning of the cervix. Toward the end of pregnancy (32-34 weeks), Braxton Hicks contractions can happen more often and may become stronger. These contractions are sometimes difficult to tell apart from true labor because they can be very uncomfortable. You should not feel embarrassed if you go to the hospital with false labor. Sometimes, the only way to tell if you are in true labor is for your health care provider to look for changes in the cervix. The health care provider will do a physical exam and may monitor your contractions. If you   are not in true labor, the exam  should show that your cervix is not dilating and your water has not broken. If there are no prenatal problems or other health problems associated with your pregnancy, it is completely safe for you to be sent home with false labor. You may continue to have Braxton Hicks contractions until you go into true labor. How can I tell the difference between true labor and false labor?  Differences  False labor  Contractions last 30-70 seconds.: Contractions are usually shorter and not as strong as true labor contractions.  Contractions become very regular.: Contractions are usually irregular.  Discomfort is usually felt in the top of the uterus, and it spreads to the lower abdomen and low back.: Contractions are often felt in the front of the lower abdomen and in the groin.  Contractions do not go away with walking.: Contractions may go away when you walk around or change positions while lying down.  Contractions usually become more intense and increase in frequency.: Contractions get weaker and are shorter-lasting as time goes on.  The cervix dilates and gets thinner.: The cervix usually does not dilate or become thin. Follow these instructions at home:  Take over-the-counter and prescription medicines only as told by your health care provider.  Keep up with your usual exercises and follow other instructions from your health care provider.  Eat and drink lightly if you think you are going into labor.  If Braxton Hicks contractions are making you uncomfortable:  Change your position from lying down or resting to walking, or change from walking to resting.  Sit and rest in a tub of warm water.  Drink enough fluid to keep your urine clear or pale yellow. Dehydration may cause these contractions.  Do slow and deep breathing several times an hour.  Keep all follow-up prenatal visits as told by your health care provider. This is important. Contact a health care provider if:  You have a  fever.  You have continuous pain in your abdomen. Get help right away if:  Your contractions become stronger, more regular, and closer together.  You have fluid leaking or gushing from your vagina.  You pass blood-tinged mucus (bloody show).  You have bleeding from your vagina.  You have low back pain that you never had before.  You feel your baby's head pushing down and causing pelvic pressure.  Your baby is not moving inside you as much as it used to. Summary  Contractions that occur before labor are called Braxton Hicks contractions, false labor, or practice contractions.  Braxton Hicks contractions are usually shorter, weaker, farther apart, and less regular than true labor contractions. True labor contractions usually become progressively stronger and regular and they become more frequent.  Manage discomfort from Braxton Hicks contractions by changing position, resting in a warm bath, drinking plenty of water, or practicing deep breathing. This information is not intended to replace advice given to you by your health care provider. Make sure you discuss any questions you have with your health care provider. Document Released: 01/13/2005 Document Revised: 12/03/2015 Document Reviewed: 12/03/2015 Elsevier Interactive Patient Education  2017 [employer]  

## 2016-04-09 NOTE — Progress Notes (Signed)
28 wk labs today; 1 hr glucola due @ 447

## 2016-04-10 LAB — HIV ANTIBODY (ROUTINE TESTING W REFLEX): HIV Screen 4th Generation wRfx: NONREACTIVE

## 2016-04-10 LAB — GLUCOSE TOLERANCE, 1 HOUR: Glucose, 1Hr PP: 106 mg/dL (ref 65–199)

## 2016-04-10 LAB — CBC
Hematocrit: 30.4 % — ABNORMAL LOW (ref 34.0–46.6)
Hemoglobin: 10 g/dL — ABNORMAL LOW (ref 11.1–15.9)
MCH: 27.4 pg (ref 26.6–33.0)
MCHC: 32.9 g/dL (ref 31.5–35.7)
MCV: 83 fL (ref 79–97)
PLATELETS: 452 10*3/uL — AB (ref 150–379)
RBC: 3.65 x10E6/uL — ABNORMAL LOW (ref 3.77–5.28)
RDW: 15.9 % — ABNORMAL HIGH (ref 12.3–15.4)
WBC: 12.9 10*3/uL — AB (ref 3.4–10.8)

## 2016-04-10 LAB — RPR: RPR Ser Ql: NONREACTIVE

## 2016-04-23 ENCOUNTER — Telehealth: Payer: Self-pay | Admitting: Family Medicine

## 2016-04-23 ENCOUNTER — Encounter: Payer: Medicaid Other | Admitting: Advanced Practice Midwife

## 2016-04-23 NOTE — Telephone Encounter (Signed)
Called patient due to missed follow-up appointment. Left message for patient to return call asap regarding appointment.

## 2016-04-28 ENCOUNTER — Ambulatory Visit (INDEPENDENT_AMBULATORY_CARE_PROVIDER_SITE_OTHER): Payer: Medicaid Other | Admitting: Student

## 2016-04-28 VITALS — BP 109/62 | HR 105 | Wt 253.3 lb

## 2016-04-28 DIAGNOSIS — O0933 Supervision of pregnancy with insufficient antenatal care, third trimester: Secondary | ICD-10-CM

## 2016-04-28 DIAGNOSIS — Z3400 Encounter for supervision of normal first pregnancy, unspecified trimester: Secondary | ICD-10-CM

## 2016-04-28 DIAGNOSIS — Z3493 Encounter for supervision of normal pregnancy, unspecified, third trimester: Secondary | ICD-10-CM | POA: Insufficient documentation

## 2016-04-28 NOTE — Patient Instructions (Signed)
Third Trimester of Pregnancy The third trimester is from week 28 through week 40 (months 7 through 9). The third trimester is a time when the unborn baby (fetus) is growing rapidly. At the end of the ninth month, the fetus is about 20 inches in length and weighs 6-10 pounds. Body changes during your third trimester Your body will continue to go through many changes during pregnancy. The changes vary from woman to woman. During the third trimester:  Your weight will continue to increase. You can expect to gain 25-35 pounds (11-16 kg) by the end of the pregnancy.  You may begin to get stretch marks on your hips, abdomen, and breasts.  You may urinate more often because the fetus is moving lower into your pelvis and pressing on your bladder.  You may develop or continue to have heartburn. This is caused by increased hormones that slow down muscles in the digestive tract.  You may develop or continue to have constipation because increased hormones slow digestion and cause the muscles that push waste through your intestines to relax.  You may develop hemorrhoids. These are swollen veins (varicose veins) in the rectum that can itch or be painful.  You may develop swollen, bulging veins (varicose veins) in your legs.  You may have increased body aches in the pelvis, back, or thighs. This is due to weight gain and increased hormones that are relaxing your joints.  You may have changes in your hair. These can include thickening of your hair, rapid growth, and changes in texture. Some women also have hair loss during or after pregnancy, or hair that feels dry or thin. Your hair will most likely return to normal after your baby is born.  Your breasts will continue to grow and they will continue to become tender. A yellow fluid (colostrum) may leak from your breasts. This is the first milk you are producing for your baby.  Your belly button may stick out.  You may notice more swelling in your hands,  face, or ankles.  You may have increased tingling or numbness in your hands, arms, and legs. The skin on your belly may also feel numb.  You may feel short of breath because of your expanding uterus.  You may have more problems sleeping. This can be caused by the size of your belly, increased need to urinate, and an increase in your body's metabolism.  You may notice the fetus "dropping," or moving lower in your abdomen (lightening).  You may have increased vaginal discharge.  You may notice your joints feel loose and you may have pain around your pelvic bone.  What to expect at prenatal visits You will have prenatal exams every 2 weeks until week 36. Then you will have weekly prenatal exams. During a routine prenatal visit:  You will be weighed to make sure you and the baby are growing normally.  Your blood pressure will be taken.  Your abdomen will be measured to track your baby's growth.  The fetal heartbeat will be listened to.  Any test results from the previous visit will be discussed.  You may have a cervical check near your due date to see if your cervix has softened or thinned (effaced).  You will be tested for Group B streptococcus. This happens between 35 and 37 weeks.  Your health care provider may ask you:  What your birth plan is.  How you are feeling.  If you are feeling the baby move.  If you have had   any abnormal symptoms, such as leaking fluid, bleeding, severe headaches, or abdominal cramping.  If you are using any tobacco products, including cigarettes, chewing tobacco, and electronic cigarettes.  If you have any questions.  Other tests or screenings that may be performed during your third trimester include:  Blood tests that check for low iron levels (anemia).  Fetal testing to check the health, activity level, and growth of the fetus. Testing is done if you have certain medical conditions or if there are problems during the  pregnancy.  Nonstress test (NST). This test checks the health of your baby to make sure there are no signs of problems, such as the baby not getting enough oxygen. During this test, a belt is placed around your belly. The baby is made to move, and its heart rate is monitored during movement.  What is false labor? False labor is a condition in which you feel small, irregular tightenings of the muscles in the womb (contractions) that usually go away with rest, changing position, or drinking water. These are called Braxton Hicks contractions. Contractions may last for hours, days, or even weeks before true labor sets in. If contractions come at regular intervals, become more frequent, increase in intensity, or become painful, you should see your health care provider. What are the signs of labor?  Abdominal cramps.  Regular contractions that start at 10 minutes apart and become stronger and more frequent with time.  Contractions that start on the top of the uterus and spread down to the lower abdomen and back.  Increased pelvic pressure and dull back pain.  A watery or bloody mucus discharge that comes from the vagina.  Leaking of amniotic fluid. This is also known as your "water breaking." It could be a slow trickle or a gush. Let your health care provider know if it has a color or strange odor. If you have any of these signs, call your health care provider right away, even if it is before your due date. Follow these instructions at home: Medicines  Follow your health care provider's instructions regarding medicine use. Specific medicines may be either safe or unsafe to take during pregnancy.  Take a prenatal vitamin that contains at least 600 micrograms (mcg) of folic acid.  If you develop constipation, try taking a stool softener if your health care provider approves. Eating and drinking  Eat a balanced diet that includes fresh fruits and vegetables, whole grains, good sources of protein  such as meat, eggs, or tofu, and low-fat dairy. Your health care provider will help you determine the amount of weight gain that is right for you.  Avoid raw meat and uncooked cheese. These carry germs that can cause birth defects in the baby.  If you have low calcium intake from food, talk to your health care provider about whether you should take a daily calcium supplement.  Eat four or five small meals rather than three large meals a day.  Limit foods that are high in fat and processed sugars, such as fried and sweet foods.  To prevent constipation: ? Drink enough fluid to keep your urine clear or pale yellow. ? Eat foods that are high in fiber, such as fresh fruits and vegetables, whole grains, and beans. Activity  Exercise only as directed by your health care provider. Most women can continue their usual exercise routine during pregnancy. Try to exercise for 30 minutes at least 5 days a week. Stop exercising if you experience uterine contractions.  Avoid heavy   lifting.  Do not exercise in extreme heat or humidity, or at high altitudes.  Wear low-heel, comfortable shoes.  Practice good posture.  You may continue to have sex unless your health care provider tells you otherwise. Relieving pain and discomfort  Take frequent breaks and rest with your legs elevated if you have leg cramps or low back pain.  Take warm sitz baths to soothe any pain or discomfort caused by hemorrhoids. Use hemorrhoid cream if your health care provider approves.  Wear a good support bra to prevent discomfort from breast tenderness.  If you develop varicose veins: ? Wear support pantyhose or compression stockings as told by your healthcare provider. ? Elevate your feet for 15 minutes, 3-4 times a day. Prenatal care  Write down your questions. Take them to your prenatal visits.  Keep all your prenatal visits as told by your health care provider. This is important. Safety  Wear your seat belt at  all times when driving.  Make a list of emergency phone numbers, including numbers for family, friends, the hospital, and police and fire departments. General instructions  Avoid cat litter boxes and soil used by cats. These carry germs that can cause birth defects in the baby. If you have a cat, ask someone to clean the litter box for you.  Do not travel far distances unless it is absolutely necessary and only with the approval of your health care provider.  Do not use hot tubs, steam rooms, or saunas.  Do not drink alcohol.  Do not use any products that contain nicotine or tobacco, such as cigarettes and e-cigarettes. If you need help quitting, ask your health care provider.  Do not use any medicinal herbs or unprescribed drugs. These chemicals affect the formation and growth of the baby.  Do not douche or use tampons or scented sanitary pads.  Do not cross your legs for long periods of time.  To prepare for the arrival of your baby: ? Take prenatal classes to understand, practice, and ask questions about labor and delivery. ? Make a trial run to the hospital. ? Visit the hospital and tour the maternity area. ? Arrange for maternity or paternity leave through employers. ? Arrange for family and friends to take care of pets while you are in the hospital. ? Purchase a rear-facing car seat and make sure you know how to install it in your car. ? Pack your hospital bag. ? Prepare the baby's nursery. Make sure to remove all pillows and stuffed animals from the baby's crib to prevent suffocation.  Visit your dentist if you have not gone during your pregnancy. Use a soft toothbrush to brush your teeth and be gentle when you floss. Contact a health care provider if:  You are unsure if you are in labor or if your water has broken.  You become dizzy.  You have mild pelvic cramps, pelvic pressure, or nagging pain in your abdominal area.  You have lower back pain.  You have persistent  nausea, vomiting, or diarrhea.  You have an unusual or bad smelling vaginal discharge.  You have pain when you urinate. Get help right away if:  Your water breaks before 37 weeks.  You have regular contractions less than 5 minutes apart before 37 weeks.  You have a fever.  You are leaking fluid from your vagina.  You have spotting or bleeding from your vagina.  You have severe abdominal pain or cramping.  You have rapid weight loss or weight gain.    You have shortness of breath with chest pain.  You notice sudden or extreme swelling of your face, hands, ankles, feet, or legs.  Your baby makes fewer than 10 movements in 2 hours.  You have severe headaches that do not go away when you take medicine.  You have vision changes. Summary  The third trimester is from week 28 through week 40, months 7 through 9. The third trimester is a time when the unborn baby (fetus) is growing rapidly.  During the third trimester, your discomfort may increase as you and your baby continue to gain weight. You may have abdominal, leg, and back pain, sleeping problems, and an increased need to urinate.  During the third trimester your breasts will keep growing and they will continue to become tender. A yellow fluid (colostrum) may leak from your breasts. This is the first milk you are producing for your baby.  False labor is a condition in which you feel small, irregular tightenings of the muscles in the womb (contractions) that eventually go away. These are called Braxton Hicks contractions. Contractions may last for hours, days, or even weeks before true labor sets in.  Signs of labor can include: abdominal cramps; regular contractions that start at 10 minutes apart and become stronger and more frequent with time; watery or bloody mucus discharge that comes from the vagina; increased pelvic pressure and dull back pain; and leaking of amniotic fluid. This information is not intended to replace advice  given to you by your health care provider. Make sure you discuss any questions you have with your health care provider. Document Released: 01/07/2001 Document Revised: 06/21/2015 Document Reviewed: 03/16/2012 Elsevier Interactive Patient Education  2017 [employer]  

## 2016-04-28 NOTE — Progress Notes (Signed)
   PRENATAL VISIT NOTE  Subjective:  Christina Mann is a 21 y.o. G1P0 at [redacted]w[redacted]d being seen today for ongoing prenatal care.  She is currently monitored for the following issues for this high-risk pregnancy and has Supervision of normal first pregnancy, antepartum; Insufficient prenatal care, third trimester; and Pregnancy with adoption planned, currently in third trimester on her problem list.  Patient reports no complaints.  Contractions: Irritability. Vag. Bleeding: None.  Movement: Present. Denies leaking of fluid.   The following portions of the patient's history were reviewed and updated as appropriate: allergies, current medications, past family history, past medical history, past social history, past surgical history and problem list. Problem list updated.  Objective:   Vitals:   04/28/16 1546  BP: 109/62  Pulse: (!) 105  Weight: 253 lb 4.8 oz (114.9 kg)    Fetal Status: Fetal Heart Rate (bpm): 150 Fundal Height: 32 cm Movement: Present     General:  Alert, oriented and cooperative. Patient is in no acute distress.  Skin: Skin is warm and dry. No rash noted.   Cardiovascular: Normal heart rate noted  Respiratory: Normal respiratory effort, no problems with respiration noted  Abdomen: Soft, gravid, appropriate for gestational age. Pain/Pressure: Present     Pelvic:  Cervical exam deferred        Extremities: Normal range of motion.  Edema: None  Mental Status: Normal mood and affect. Normal behavior. Normal judgment and thought content.   Assessment and Plan:  Pregnancy: G1P0 at [redacted]w[redacted]d  1. Supervision of normal first pregnancy, antepartum   2. Insufficient prenatal care, third trimester - late to care & has missed several appointments  3. Pregnancy with adoption planned, currently in third trimester -[facility]  Preterm labor symptoms and general obstetric precautions including but not limited to vaginal bleeding, contractions, leaking of fluid and  fetal movement were reviewed in detail with the patient. Please refer to After Visit Summary for other counseling recommendations.  Return in about 2 weeks (around 05/12/2016) for Routine OB.   Judeth Horn, NP

## 2016-05-15 ENCOUNTER — Ambulatory Visit (INDEPENDENT_AMBULATORY_CARE_PROVIDER_SITE_OTHER): Payer: Medicaid Other | Admitting: Student

## 2016-05-15 ENCOUNTER — Other Ambulatory Visit (HOSPITAL_COMMUNITY)
Admission: RE | Admit: 2016-05-15 | Discharge: 2016-05-15 | Disposition: A | Discharge status: Home / Self Care | Payer: Medicaid Other | Source: Ambulatory Visit | Attending: Student | Admitting: Student

## 2016-05-15 VITALS — BP 102/59 | HR 110 | Wt 255.1 lb

## 2016-05-15 DIAGNOSIS — Z3403 Encounter for supervision of normal first pregnancy, third trimester: Secondary | ICD-10-CM | POA: Insufficient documentation

## 2016-05-15 DIAGNOSIS — Z3A36 36 weeks gestation of pregnancy: Secondary | ICD-10-CM | POA: Insufficient documentation

## 2016-05-15 MED ORDER — CEPHALEXIN 500 MG PO CAPS: 500.0000 mg | ORAL_CAPSULE | Freq: Four times a day (QID) | ORAL | 0 refills | Status: AC

## 2016-05-15 NOTE — Patient Instructions (Signed)
Asymptomatic Bacteriuria Asymptomatic bacteriuria is the presence of a large number of bacteria in the urine without the usual symptoms of burning or frequent urination. What are the causes? This condition is caused by an increase in bacteria in the urine. This increase can be caused by:  Bacteria entering the urinary tract, such as during sex.  A blockage in the urinary tract, such as from kidney stones or a tumor.  Bladder problems that prevent the bladder from emptying.  What increases the risk? You are more likely to develop this condition if:  You have diabetes mellitus.  You are an elderly adult, especially if you are also in a long-term care facility.  You are pregnant and in the first trimester.  You have kidney stones.  You are female.  You have had a kidney transplant.  You have a leaky kidney tube valve (reflux).  You had a urinary catheter for a long period of time.  What are the signs or symptoms? There are no symptoms of this condition. How is this diagnosed? This condition is diagnosed with a urine test. Because this condition does not cause symptoms, it is usually diagnosed when a urine sample is taken to treat or diagnose another condition, such as pregnancy or kidney problems. Most women who are in their first trimester of pregnancy are screened for asymptomatic bacteriuria. How is this treated? Usually, treatment is not needed for this condition. Treating the condition can lead to other problems, such as a yeast infection or the growth of bacteria that do not respond to treatment (antibiotic-resistant bacteria). Some people, such as pregnant women and people with kidney transplants, do need treatment with antibiotic medicines to prevent kidney infection (pyelonephritis). In pregnant women, kidney infection can lead to premature labor, fetal growth restriction, or newborn death. Follow these instructions at home: Medicines  Take over-the-counter and  prescription medicines only as told by your health care provider.  If you were prescribed an antibiotic medicine, take it as told by your health care provider. Do not stop taking the antibiotic even if you start to feel better. General instructions  Monitor your condition for any changes.  Drink enough fluid to keep your urine clear or pale yellow.  Go to the bathroom more often to keep your bladder empty.  If you are female, keep the area around your vagina and rectum clean. Wipe yourself from front to back after urinating.  Keep all follow-up visits as told by your health care provider. This is important. Contact a health care provider if:  You notice any new symptoms, such as back pain or burning while urinating. Get help right away if:  You develop signs of an infection such as: ? A burning sensation when you urinate. ? Have pain when you urinate. ? Develop an intense need to urinate. ? Urinating more frequently. ? Back pain or pelvic pain. ? Fever or chills.  You have blood in your urine.  Your urine becomes discolored or cloudy.  Your urine smells bad.  You have severe pain that cannot be controlled with medicine. Summary  Asymptomatic bacteriuria is the presence of a large number of bacteria in the urine without the usual symptoms of burning or frequent urination.  Usually, treatment is not needed for this condition. Treating the condition can lead to other problems, such as too much yeast and the growth of antibiotic-resistant bacteria.  Some people, such as pregnant women and people with kidney transplants, do need treatment with antibiotic medicines to prevent   kidney infection (pyelonephritis).  If you were prescribed an antibiotic medicine, take it as told by your health care provider. Do not stop taking the antibiotic even if you start to feel better. This information is not intended to replace advice given to you by your health care provider. Make sure you  discuss any questions you have with your health care provider. Document Released: 01/13/2005 Document Revised: 01/08/2016 Document Reviewed: 01/08/2016 Elsevier Interactive Patient Education  2017 [employer]  

## 2016-05-15 NOTE — Progress Notes (Signed)
   PRENATAL VISIT NOTE  Subjective:  Christina Mann is a 21 y.o. G1P0 at [redacted]w[redacted]d being seen today for ongoing prenatal care.  She is currently monitored for the following issues for this low-risk pregnancy and has Supervision of normal first pregnancy, antepartum; Insufficient prenatal care, third trimester; and Pregnancy with adoption planned, currently in third trimester on her problem list.  Patient reports no complaints.  Contractions: Not present. Vag. Bleeding: None.  Movement: Present. Denies leaking of fluid.   The following portions of the patient's history were reviewed and updated as appropriate: allergies, current medications, past family history, past medical history, past social history, past surgical history and problem list. Problem list updated.  Objective:   Vitals:   05/15/16 1257  BP: (!) 102/59  Pulse: (!) 110  Weight: 255 lb 1.6 oz (115.7 kg)    Fetal Status: Fetal Heart Rate (bpm): 156 Fundal Height: 36 cm Movement: Present     General:  Alert, oriented and cooperative. Patient is in no acute distress.  Skin: Skin is warm and dry. No rash noted.   Cardiovascular: Normal heart rate noted  Respiratory: Normal respiratory effort, no problems with respiration noted  Abdomen: Soft, gravid, appropriate for gestational age. Pain/Pressure: Present     Pelvic:  Cervical exam deferred        Extremities: Normal range of motion.  Edema: None  Mental Status: Normal mood and affect. Normal behavior. Normal judgment and thought content.   Assessment and Plan:  Pregnancy: G1P0 at [redacted]w[redacted]d  1. Encounter for supervision of normal first pregnancy in third trimester Patient has no complaints;  - Strep Gp B NAA - Cervicovaginal ancillary only  Term labor symptoms and general obstetric precautions including but not limited to vaginal bleeding, contractions, leaking of fluid and fetal movement were reviewed in detail with the patient. Please refer to After Visit Summary for  other counseling recommendations.  Return in about 1 week (around 05/22/2016).   Marylene Land, CNM

## 2016-05-16 LAB — CERVICOVAGINAL ANCILLARY ONLY
Chlamydia: NEGATIVE
NEISSERIA GONORRHEA: NEGATIVE

## 2016-05-17 LAB — STREP GP B NAA: STREP GROUP B AG: NEGATIVE

## 2016-05-22 ENCOUNTER — Ambulatory Visit (INDEPENDENT_AMBULATORY_CARE_PROVIDER_SITE_OTHER): Payer: Medicaid Other | Admitting: Medical

## 2016-05-22 VITALS — BP 98/62 | HR 99 | Wt 259.0 lb

## 2016-05-22 DIAGNOSIS — Z3403 Encounter for supervision of normal first pregnancy, third trimester: Secondary | ICD-10-CM

## 2016-05-22 DIAGNOSIS — Z3493 Encounter for supervision of normal pregnancy, unspecified, third trimester: Secondary | ICD-10-CM

## 2016-05-22 DIAGNOSIS — Z3400 Encounter for supervision of normal first pregnancy, unspecified trimester: Secondary | ICD-10-CM

## 2016-05-22 NOTE — Progress Notes (Signed)
   PRENATAL VISIT NOTE  Subjective:  Christina Mann is a 21 y.o. G1P0 at [redacted]w[redacted]d being seen today for ongoing prenatal care.  She is currently monitored for the following issues for this low-risk pregnancy and has Supervision of normal first pregnancy, antepartum; Insufficient prenatal care, third trimester; and Pregnancy with adoption planned, currently in third trimester on her problem list.  Patient reports no complaints.  Contractions: Not present. Vag. Bleeding: None.  Movement: Present. Denies leaking of fluid.   The following portions of the patient's history were reviewed and updated as appropriate: allergies, current medications, past family history, past medical history, past social history, past surgical history and problem list. Problem list updated.  Objective:   Vitals:   05/22/16 1616  BP: 98/62  Pulse: 99  Weight: 259 lb (117.5 kg)    Fetal Status: Fetal Heart Rate (bpm): 133 Fundal Height: 37 cm Movement: Present     General:  Alert, oriented and cooperative. Patient is in no acute distress.  Skin: Skin is warm and dry. No rash noted.   Cardiovascular: Normal heart rate noted  Respiratory: Normal respiratory effort, no problems with respiration noted  Abdomen: Soft, gravid, appropriate for gestational age. Pain/Pressure: Absent     Pelvic:  Cervical exam deferred        Extremities: Normal range of motion.  Edema: Trace  Mental Status: Normal mood and affect. Normal behavior. Normal judgment and thought content.   Assessment and Plan:  Pregnancy: G1P0 at [redacted]w[redacted]d  1. Encounter for supervision of normal first pregnancy in third trimester  2. Pregnancy with adoption planned, currently in third trimester - Coordinator from Adoption agency is present for visit today. Patient gives permission to discuss care with coordinator. All questions answered.    Term labor symptoms and general obstetric precautions including but not limited to vaginal bleeding, contractions,  leaking of fluid and fetal movement were reviewed in detail with the patient. Please refer to After Visit Summary for other counseling recommendations.  Return in about 1 week (around 05/29/2016) for LOB.   Marny Lowenstein, PA-C

## 2016-05-22 NOTE — Patient Instructions (Signed)
Fetal Movement Counts Patient Name: ________________________________________________ Patient Due Date: ____________________ What is a fetal movement count? A fetal movement count is the number of times that you feel your baby move during a certain amount of time. This may also be called a fetal kick count. A fetal movement count is recommended for every pregnant woman. You may be asked to start counting fetal movements as early as week 28 of your pregnancy. Pay attention to when your baby is most active. You may notice your baby's sleep and wake cycles. You may also notice things that make your baby move more. You should do a fetal movement count:  When your baby is normally most active.  At the same time each day. A good time to count movements is while you are resting, after having something to eat and drink. How do I count fetal movements? 1. Find a quiet, comfortable area. Sit, or lie down on your side. 2. Write down the date, the start time and stop time, and the number of movements that you felt between those two times. Take this information with you to your health care visits. 3. For 2 hours, count kicks, flutters, swishes, rolls, and jabs. You should feel at least 10 movements during 2 hours. 4. You may stop counting after you have felt 10 movements. 5. If you do not feel 10 movements in 2 hours, have something to eat and drink. Then, keep resting and counting for 1 hour. If you feel at least 4 movements during that hour, you may stop counting. Contact a health care provider if:  You feel fewer than 4 movements in 2 hours.  Your baby is not moving like he or she usually does. Date: ____________ Start time: ____________ Stop time: ____________ Movements: ____________ Date: ____________ Start time: ____________ Stop time: ____________ Movements: ____________ Date: ____________ Start time: ____________ Stop time: ____________ Movements: ____________ Date: ____________ Start time:  ____________ Stop time: ____________ Movements: ____________ Date: ____________ Start time: ____________ Stop time: ____________ Movements: ____________ Date: ____________ Start time: ____________ Stop time: ____________ Movements: ____________ Date: ____________ Start time: ____________ Stop time: ____________ Movements: ____________ Date: ____________ Start time: ____________ Stop time: ____________ Movements: ____________ Date: ____________ Start time: ____________ Stop time: ____________ Movements: ____________ This information is not intended to replace advice given to you by your health care provider. Make sure you discuss any questions you have with your health care provider. Document Released: 02/12/2006 Document Revised: 09/12/2015 Document Reviewed: 02/22/2015 Elsevier Interactive Patient Education  2017 [employer] Braxton Hicks Contractions Contractions of the uterus can occur throughout pregnancy, but they are not always a sign that you are in labor. You may have practice contractions called Braxton Hicks contractions. These false labor contractions are sometimes confused with true labor. What are Braxton Hicks contractions? Braxton Hicks contractions are tightening movements that occur in the muscles of the uterus before labor. Unlike true labor contractions, these contractions do not result in opening (dilation) and thinning of the cervix. Toward the end of pregnancy (32-34 weeks), Braxton Hicks contractions can happen more often and may become stronger. These contractions are sometimes difficult to tell apart from true labor because they can be very uncomfortable. You should not feel embarrassed if you go to the hospital with false labor. Sometimes, the only way to tell if you are in true labor is for your health care provider to look for changes in the cervix. The health care provider will do a physical exam and may monitor your contractions. If you   are not in true labor, the exam  should show that your cervix is not dilating and your water has not broken. If there are no prenatal problems or other health problems associated with your pregnancy, it is completely safe for you to be sent home with false labor. You may continue to have Braxton Hicks contractions until you go into true labor. How can I tell the difference between true labor and false labor?  Differences  False labor  Contractions last 30-70 seconds.: Contractions are usually shorter and not as strong as true labor contractions.  Contractions become very regular.: Contractions are usually irregular.  Discomfort is usually felt in the top of the uterus, and it spreads to the lower abdomen and low back.: Contractions are often felt in the front of the lower abdomen and in the groin.  Contractions do not go away with walking.: Contractions may go away when you walk around or change positions while lying down.  Contractions usually become more intense and increase in frequency.: Contractions get weaker and are shorter-lasting as time goes on.  The cervix dilates and gets thinner.: The cervix usually does not dilate or become thin. Follow these instructions at home:  Take over-the-counter and prescription medicines only as told by your health care provider.  Keep up with your usual exercises and follow other instructions from your health care provider.  Eat and drink lightly if you think you are going into labor.  If Braxton Hicks contractions are making you uncomfortable:  Change your position from lying down or resting to walking, or change from walking to resting.  Sit and rest in a tub of warm water.  Drink enough fluid to keep your urine clear or pale yellow. Dehydration may cause these contractions.  Do slow and deep breathing several times an hour.  Keep all follow-up prenatal visits as told by your health care provider. This is important. Contact a health care provider if:  You have a  fever.  You have continuous pain in your abdomen. Get help right away if:  Your contractions become stronger, more regular, and closer together.  You have fluid leaking or gushing from your vagina.  You pass blood-tinged mucus (bloody show).  You have bleeding from your vagina.  You have low back pain that you never had before.  You feel your baby's head pushing down and causing pelvic pressure.  Your baby is not moving inside you as much as it used to. Summary  Contractions that occur before labor are called Braxton Hicks contractions, false labor, or practice contractions.  Braxton Hicks contractions are usually shorter, weaker, farther apart, and less regular than true labor contractions. True labor contractions usually become progressively stronger and regular and they become more frequent.  Manage discomfort from Braxton Hicks contractions by changing position, resting in a warm bath, drinking plenty of water, or practicing deep breathing. This information is not intended to replace advice given to you by your health care provider. Make sure you discuss any questions you have with your health care provider. Document Released: 01/13/2005 Document Revised: 12/03/2015 Document Reviewed: 12/03/2015 Elsevier Interactive Patient Education  2017 [employer]  

## 2016-05-29 ENCOUNTER — Ambulatory Visit (INDEPENDENT_AMBULATORY_CARE_PROVIDER_SITE_OTHER): Payer: Medicaid Other | Admitting: Student

## 2016-05-29 VITALS — BP 124/71 | HR 89 | Wt 256.5 lb

## 2016-05-29 DIAGNOSIS — Z3483 Encounter for supervision of other normal pregnancy, third trimester: Secondary | ICD-10-CM

## 2016-05-29 DIAGNOSIS — Z3400 Encounter for supervision of normal first pregnancy, unspecified trimester: Secondary | ICD-10-CM

## 2016-05-29 DIAGNOSIS — Z3493 Encounter for supervision of normal pregnancy, unspecified, third trimester: Secondary | ICD-10-CM

## 2016-05-29 NOTE — Patient Instructions (Signed)

## 2016-05-29 NOTE — Progress Notes (Signed)
   PRENATAL VISIT NOTE  Subjective:  Christina Mann is a 21 y.o. G1P0 at 1520w0d being seen today for ongoing prenatal care.  She is currently monitored for the following issues for this low-risk pregnancy and has Supervision of normal first pregnancy, antepartum; Insufficient prenatal care, third trimester; and Pregnancy with adoption planned, currently in third trimester on her problem list.  Patient reports no complaints.  Contractions: Not present. Vag. Bleeding: None.  Movement: Present. Denies leaking of fluid.   The following portions of the patient's history were reviewed and updated as appropriate: allergies, current medications, past family history, past medical history, past social history, past surgical history and problem list. Problem list updated.  Objective:   Vitals:   05/29/16 1057  BP: 124/71  Pulse: 89  Weight: 256 lb 8 oz (116.3 kg)    Fetal Status: Fetal Heart Rate (bpm): 140 Fundal Height: 37 cm Movement: Present  Presentation: Vertex  General:  Alert, oriented and cooperative. Patient is in no acute distress.  Skin: Skin is warm and dry. No rash noted.   Cardiovascular: Normal heart rate noted  Respiratory: Normal respiratory effort, no problems with respiration noted  Abdomen: Soft, gravid, appropriate for gestational age. Pain/Pressure: Absent     Pelvic:  Cervical exam deferred      Pt declined  Extremities: Normal range of motion.  Edema: Trace  Mental Status: Normal mood and affect. Normal behavior. Normal judgment and thought content.   Assessment and Plan:  Pregnancy: G1P0 at 4120w0d  1. Supervision of normal first pregnancy, antepartum   2. Pregnancy with adoption planned, currently in third trimester   Term labor symptoms and general obstetric precautions including but not limited to vaginal bleeding, contractions, leaking of fluid and fetal movement were reviewed in detail with the patient. Please refer to After Visit Summary for other  counseling recommendations.  Return in about 1 week (around 06/05/2016) for Routine OB.   Judeth HornErin Zeynep Fantroy, NP

## 2016-06-04 ENCOUNTER — Ambulatory Visit (INDEPENDENT_AMBULATORY_CARE_PROVIDER_SITE_OTHER): Payer: Medicaid Other | Admitting: Medical

## 2016-06-04 VITALS — BP 98/67 | HR 100 | Wt 254.0 lb

## 2016-06-04 DIAGNOSIS — Z3400 Encounter for supervision of normal first pregnancy, unspecified trimester: Secondary | ICD-10-CM

## 2016-06-04 DIAGNOSIS — B3731 Acute candidiasis of vulva and vagina: Secondary | ICD-10-CM

## 2016-06-04 DIAGNOSIS — B373 Candidiasis of vulva and vagina: Secondary | ICD-10-CM

## 2016-06-04 DIAGNOSIS — Z3401 Encounter for supervision of normal first pregnancy, first trimester: Secondary | ICD-10-CM

## 2016-06-04 LAB — POCT URINALYSIS DIP (DEVICE)
Glucose, UA: NEGATIVE mg/dL
HGB URINE DIPSTICK: NEGATIVE
Ketones, ur: NEGATIVE mg/dL
Leukocytes, UA: NEGATIVE
NITRITE: NEGATIVE
PH: 6.5 (ref 5.0–8.0)
PROTEIN: NEGATIVE mg/dL
Specific Gravity, Urine: 1.015 (ref 1.005–1.030)
UROBILINOGEN UA: 2 mg/dL — AB (ref 0.0–1.0)

## 2016-06-04 MED ORDER — TERCONAZOLE 0.8 % VA CREA: 1.0000 | TOPICAL_CREAM | Freq: Every day | VAGINAL | 0 refills | Status: DC

## 2016-06-04 NOTE — Progress Notes (Signed)
   PRENATAL VISIT NOTE  Subjective:  Christina ListenChelsey R Lengacher is a 21 y.o. G1P0 at 6756w6d being seen today for ongoing prenatal care.  She is currently monitored for the following issues for this low-risk pregnancy and has Supervision of normal first pregnancy, antepartum; Insufficient prenatal care, third trimester; and Pregnancy with adoption planned, currently in third trimester on her problem list.  Patient reports no complaints.  Contractions: Irritability. Vag. Bleeding: None.  Movement: Present. Denies leaking of fluid.   The following portions of the patient's history were reviewed and updated as appropriate: allergies, current medications, past family history, past medical history, past social history, past surgical history and problem list. Problem list updated.  Objective:   Vitals:   06/04/16 1426  BP: 98/67  Pulse: 100  Weight: 254 lb (115.2 kg)    Fetal Status: Fetal Heart Rate (bpm): 150 Fundal Height: 39 cm Movement: Present  Presentation: Vertex  General:  Alert, oriented and cooperative. Patient is in no acute distress.  Skin: Skin is warm and dry. No rash noted.   Cardiovascular: Normal heart rate noted  Respiratory: Normal respiratory effort, no problems with respiration noted  Abdomen: Soft, gravid, appropriate for gestational age. Pain/Pressure: Present     Pelvic:  Cervical exam performed Dilation: Fingertip Effacement (%): 50 Station: Ballotable bilateral labia with notable thick, white discharge and mild erythema  Extremities: Normal range of motion.     Mental Status: Normal mood and affect. Normal behavior. Normal judgment and thought content.   Results for orders placed or performed in visit on 06/04/16 (from the past 24 hour(s))  POCT urinalysis dip (device)     Status: Abnormal   Collection Time: 06/04/16  2:50 PM  Result Value Ref Range   Glucose, UA NEGATIVE NEGATIVE mg/dL   Bilirubin Urine SMALL (A) NEGATIVE   Ketones, ur NEGATIVE NEGATIVE mg/dL   Specific Gravity, Urine 1.015 1.005 - 1.030   Hgb urine dipstick NEGATIVE NEGATIVE   pH 6.5 5.0 - 8.0   Protein, ur NEGATIVE NEGATIVE mg/dL   Urobilinogen, UA 2.0 (H) 0.0 - 1.0 mg/dL   Nitrite NEGATIVE NEGATIVE   Leukocytes, UA NEGATIVE NEGATIVE    Assessment and Plan:  Pregnancy: G1P0 at 3156w6d  1. Supervision of normal first pregnancy, antepartum - Patient had been given Rx for Keflex to be taken QID for UTI. States that she has been taking 1 tab q day "when she remembers" - Advised of need to take Keflex properly. No fever or CVA tenderness today.  - UA negative, will send for culture  2. Yeast vaginitis - terconazole (TERAZOL 3) 0.8 % vaginal cream; Place 1 applicator vaginally at bedtime.  Dispense: 20 g; Refill: 0  Term labor symptoms and general obstetric precautions including but not limited to vaginal bleeding, contractions, leaking of fluid and fetal movement were reviewed in detail with the patient. Please refer to After Visit Summary for other counseling recommendations.  Return in about 1 week (around 06/11/2016) for LOB.   Marny LowensteinWenzel, Isahi Godwin N, PA-C

## 2016-06-04 NOTE — Patient Instructions (Signed)
Fetal Movement Counts Patient Name: ________________________________________________ Patient Due Date: ____________________ What is a fetal movement count? A fetal movement count is the number of times that you feel your baby move during a certain amount of time. This may also be called a fetal kick count. A fetal movement count is recommended for every pregnant woman. You may be asked to start counting fetal movements as early as week 28 of your pregnancy. Pay attention to when your baby is most active. You may notice your baby's sleep and wake cycles. You may also notice things that make your baby move more. You should do a fetal movement count:  When your baby is normally most active.  At the same time each day. A good time to count movements is while you are resting, after having something to eat and drink. How do I count fetal movements? 1. Find a quiet, comfortable area. Sit, or lie down on your side. 2. Write down the date, the start time and stop time, and the number of movements that you felt between those two times. Take this information with you to your health care visits. 3. For 2 hours, count kicks, flutters, swishes, rolls, and jabs. You should feel at least 10 movements during 2 hours. 4. You may stop counting after you have felt 10 movements. 5. If you do not feel 10 movements in 2 hours, have something to eat and drink. Then, keep resting and counting for 1 hour. If you feel at least 4 movements during that hour, you may stop counting. Contact a health care provider if:  You feel fewer than 4 movements in 2 hours.  Your baby is not moving like he or she usually does. Date: ____________ Start time: ____________ Stop time: ____________ Movements: ____________ Date: ____________ Start time: ____________ Stop time: ____________ Movements: ____________ Date: ____________ Start time: ____________ Stop time: ____________ Movements: ____________ Date: ____________ Start time:  ____________ Stop time: ____________ Movements: ____________ Date: ____________ Start time: ____________ Stop time: ____________ Movements: ____________ Date: ____________ Start time: ____________ Stop time: ____________ Movements: ____________ Date: ____________ Start time: ____________ Stop time: ____________ Movements: ____________ Date: ____________ Start time: ____________ Stop time: ____________ Movements: ____________ Date: ____________ Start time: ____________ Stop time: ____________ Movements: ____________ This information is not intended to replace advice given to you by your health care provider. Make sure you discuss any questions you have with your health care provider. Document Released: 02/12/2006 Document Revised: 09/12/2015 Document Reviewed: 02/22/2015 Elsevier Interactive Patient Education  2017 Else[employer] Inc[employer] of the uterus can occur throughout pregnancy, but they are not always a sign that you are in labor. You may have practice contractions called Braxton Hicks contractions. These false labor contractions are sometimes confused with true labor. What are [place] contractions? Braxton Hicks contractions are tightening movements that occur in the muscles of the uterus before labor. Unlike true labor contractions, these contractions do not result in opening (dilation) and thinning of the cervix. Toward the end of pregnancy (32-34 weeks), Braxton Hicks contractions can happen more often and may become stronger. These contractions are sometimes difficult to tell apart from true labor because they can be very uncomfortable. You should not feel embarrassed if you go to the hospital with false labor. Sometimes, the only way to tell if you are in true labor is for your health care provider to look for changes in the cervix. The health care provider will do a physical exam and may monitor your contractions. If you  are not in true labor, the exam  should show that your cervix is not dilating and your water has not broken. If there are no prenatal problems or other health problems associated with your pregnancy, it is completely safe for you to be sent home with false labor. You may continue to have Braxton Hicks contractions until you go into true labor. How can I tell the difference between true labor and false labor?  Differences  False labor  Contractions last 30-70 seconds.: Contractions are usually shorter and not as strong as true labor contractions.  Contractions become very regular.: Contractions are usually irregular.  Discomfort is usually felt in the top of the uterus, and it spreads to the lower abdomen and low back.: Contractions are often felt in the front of the lower abdomen and in the groin.  Contractions do not go away with walking.: Contractions may go away when you walk around or change positions while lying down.  Contractions usually become more intense and increase in frequency.: Contractions get weaker and are shorter-lasting as time goes on.  The cervix dilates and gets thinner.: The cervix usually does not dilate or become thin. Follow these instructions at home:  Take over-the-counter and prescription medicines only as told by your health care provider.  Keep up with your usual exercises and follow other instructions from your health care provider.  Eat and drink lightly if you think you are going into labor.  If Braxton Hicks contractions are making you uncomfortable:  Change your position from lying down or resting to walking, or change from walking to resting.  Sit and rest in a tub of warm water.  Drink enough fluid to keep your urine clear or pale yellow. Dehydration may cause these contractions.  Do slow and deep breathing several times an hour.  Keep all follow-up prenatal visits as told by your health care provider. This is important. Contact a health care provider if:  You have a  fever.  You have continuous pain in your abdomen. Get help right away if:  Your contractions become stronger, more regular, and closer together.  You have fluid leaking or gushing from your vagina.  You pass blood-tinged mucus (bloody show).  You have bleeding from your vagina.  You have low back pain that you never had before.  You feel your baby's head pushing down and causing pelvic pressure.  Your baby is not moving inside you as much as it used to. Summary  Contractions that occur before labor are called Braxton Hicks contractions, false labor, or practice contractions.  Braxton Hicks contractions are usually shorter, weaker, farther apart, and less regular than true labor contractions. True labor contractions usually become progressively stronger and regular and they become more frequent.  Manage discomfort from [facility] contractions by changing position, resting in a warm bath, drinking plenty of water, or practicing deep breathing. This information is not intended to replace advice given to you by your health care provider. Make sure you discuss any questions you have with your health care provider. Document Released: 01/13/2005 Document Revised: 12/03/2015 Document Reviewed: 12/03/2015 Elsevier Interactive Patient Education  2017 Elsevier Inc. Pregnancy and Urinary Tract Infection What is a urinary tract infection? A urinary tract infection (UTI) is an infection of any part of the urinary tract. This includes the kidneys, the tubes that connect your kidneys to your bladder (ureters), the bladder, and the tube that carries urine out of your body (urethra). These organs make, store, and get rid  of urine in the body. A UTI can be a bladder infection (cystitis) or a kidney infection (pyelonephritis). This infection may be caused by fungi, viruses, and bacteria. Bacteria are the most common cause of UTIs. You are more likely to develop a UTI during pregnancy because:  The  physical and hormonal changes your body goes through can make it easier for bacteria to get into your urinary tract.  Your growing baby puts pressure on your uterus and can affect urine flow. Does a UTI place my baby at risk? An untreated UTI during pregnancy could lead to a kidney infection, which can cause health problems that could affect your baby. Possible complications of an untreated UTI include:  Having your baby before 37 weeks of pregnancy (premature).  Having a baby with a low birth weight.  Developing high blood pressure during pregnancy (preeclampsia). What are the symptoms of a UTI? Symptoms of a UTI include:  Fever.  Frequent urination or passing small amounts of urine frequently.  Needing to urinate urgently.  Pain or a burning sensation with urination.  Urine that smells bad or unusual.  Cloudy urine.  Pain in the lower abdomen or back.  Trouble urinating.  Blood in the urine.  Vomiting or being less hungry than normal.  Diarrhea or abdominal pain.  Vaginal discharge. What are the treatment options for a UTI during pregnancy? Treatment for this condition may include:  Antibiotic medicines that are safe to take during pregnancy.  Other medicines to treat less common causes of UTI. How can I prevent a UTI?   To prevent a UTI:  Go to the bathroom as soon as you feel the need.  Always wipe from front to back.  Wash your genital area with soap and warm water daily.  Empty your bladder before and after sex.  Wear cotton underwear.  Limit your intake of high sugar foods or drinks, such as regular soda, juice, and sweets.  Drink 6-8 glasses of water daily.  Do not wear tight-fitting pants.  Do not douche or use deodorant sprays.  Do not drink alcohol, caffeine, or carbonated drinks. These can irritate the bladder. Contact a health care provider if:  Your symptoms do not improve or get worse.  You have a fever after two days of  treatment.  You have a rash.  You have abnormal vaginal discharge.  You have back or side pain.  You have chills.  You have nausea and vomiting. Get help right away if: Seek immediate medical care if you are pregnant and:  You feel contractions in your uterus.  You have lower belly pain.  You have a gush of fluid from your vagina.  You have blood in your urine.  You are vomiting and cannot keep down any medicines or water. This information is not intended to replace advice given to you by your health care provider. Make sure you discuss any questions you have with your health care provider. Document Released: 05/10/2010 Document Revised: 12/28/2015 Document Reviewed: 12/04/2014 Elsevier Interactive Patient Education  2017 [employer]

## 2016-06-08 LAB — CULTURE, OB URINE

## 2016-06-08 LAB — URINE CULTURE, OB REFLEX

## 2016-06-11 ENCOUNTER — Ambulatory Visit (INDEPENDENT_AMBULATORY_CARE_PROVIDER_SITE_OTHER): Payer: Medicaid Other | Admitting: Medical

## 2016-06-11 VITALS — BP 105/70 | HR 112 | Wt 252.7 lb

## 2016-06-11 DIAGNOSIS — Z3493 Encounter for supervision of normal pregnancy, unspecified, third trimester: Secondary | ICD-10-CM

## 2016-06-11 DIAGNOSIS — Z3403 Encounter for supervision of normal first pregnancy, third trimester: Secondary | ICD-10-CM

## 2016-06-11 DIAGNOSIS — Z3400 Encounter for supervision of normal first pregnancy, unspecified trimester: Secondary | ICD-10-CM

## 2016-06-11 NOTE — Progress Notes (Signed)
   PRENATAL VISIT NOTE  Subjective:  Christina ListenChelsey R Dsouza is a 21 y.o. G1P0 at 7568w6d being seen today for ongoing prenatal care.  She is currently monitored for the following issues for this low-risk pregnancy and has Supervision of normal first pregnancy, antepartum; Insufficient prenatal care, third trimester; and Pregnancy with adoption planned, currently in third trimester on her problem list.  Patient reports no complaints.  Contractions: Not present. Vag. Bleeding: None.  Movement: Present. Denies leaking of fluid.   The following portions of the patient's history were reviewed and updated as appropriate: allergies, current medications, past family history, past medical history, past social history, past surgical history and problem list. Problem list updated.  Objective:   Vitals:   06/11/16 1431  BP: 105/70  Pulse: (!) 112  Weight: 252 lb 11.2 oz (114.6 kg)    Fetal Status: Fetal Heart Rate (bpm): 135 Fundal Height: 39 cm Movement: Present     General:  Alert, oriented and cooperative. Patient is in no acute distress.  Skin: Skin is warm and dry. No rash noted.   Cardiovascular: Normal heart rate noted  Respiratory: Normal respiratory effort, no problems with respiration noted  Abdomen: Soft, gravid, appropriate for gestational age. Pain/Pressure: Present     Pelvic:  Cervical exam deferred        Extremities: Normal range of motion.  Edema: None  Mental Status: Normal mood and affect. Normal behavior. Normal judgment and thought content.   Assessment and Plan:  Pregnancy: G1P0 at 568w6d  1. Supervision of normal first pregnancy, antepartum - GBS negative  2. Pregnancy with adoption planned, currently in third trimester   Term labor symptoms and general obstetric precautions including but not limited to vaginal bleeding, contractions, leaking of fluid and fetal movement were reviewed in detail with the patient. Please refer to After Visit Summary for other counseling  recommendations.  Return in about 1 week (around 06/18/2016) for NST/AFI and LOB.  Induction scheduled for 06/17/16 for post dates   Vonzella NippleJulie Wenzel, PA-C

## 2016-06-11 NOTE — Patient Instructions (Signed)
Fetal Movement Counts Patient Name: ________________________________________________ Patient Due Date: ____________________ What is a fetal movement count? A fetal movement count is the number of times that you feel your baby move during a certain amount of time. This may also be called a fetal kick count. A fetal movement count is recommended for every pregnant woman. You may be asked to start counting fetal movements as early as week 28 of your pregnancy. Pay attention to when your baby is most active. You may notice your baby's sleep and wake cycles. You may also notice things that make your baby move more. You should do a fetal movement count:  When your baby is normally most active.  At the same time each day. A good time to count movements is while you are resting, after having something to eat and drink. How do I count fetal movements? 1. Find a quiet, comfortable area. Sit, or lie down on your side. 2. Write down the date, the start time and stop time, and the number of movements that you felt between those two times. Take this information with you to your health care visits. 3. For 2 hours, count kicks, flutters, swishes, rolls, and jabs. You should feel at least 10 movements during 2 hours. 4. You may stop counting after you have felt 10 movements. 5. If you do not feel 10 movements in 2 hours, have something to eat and drink. Then, keep resting and counting for 1 hour. If you feel at least 4 movements during that hour, you may stop counting. Contact a health care provider if:  You feel fewer than 4 movements in 2 hours.  Your baby is not moving like he or she usually does. Date: ____________ Start time: ____________ Stop time: ____________ Movements: ____________ Date: ____________ Start time: ____________ Stop time: ____________ Movements: ____________ Date: ____________ Start time: ____________ Stop time: ____________ Movements: ____________ Date: ____________ Start time:  ____________ Stop time: ____________ Movements: ____________ Date: ____________ Start time: ____________ Stop time: ____________ Movements: ____________ Date: ____________ Start time: ____________ Stop time: ____________ Movements: ____________ Date: ____________ Start time: ____________ Stop time: ____________ Movements: ____________ Date: ____________ Start time: ____________ Stop time: ____________ Movements: ____________ Date: ____________ Start time: ____________ Stop time: ____________ Movements: ____________ This information is not intended to replace advice given to you by your health care provider. Make sure you discuss any questions you have with your health care provider. Document Released: 02/12/2006 Document Revised: 09/12/2015 Document Reviewed: 02/22/2015 Elsevier Interactive Patient Education  2017 [employer] Braxton Hicks Contractions Contractions of the uterus can occur throughout pregnancy, but they are not always a sign that you are in labor. You may have practice contractions called Braxton Hicks contractions. These false labor contractions are sometimes confused with true labor. What are Braxton Hicks contractions? Braxton Hicks contractions are tightening movements that occur in the muscles of the uterus before labor. Unlike true labor contractions, these contractions do not result in opening (dilation) and thinning of the cervix. Toward the end of pregnancy (32-34 weeks), Braxton Hicks contractions can happen more often and may become stronger. These contractions are sometimes difficult to tell apart from true labor because they can be very uncomfortable. You should not feel embarrassed if you go to the hospital with false labor. Sometimes, the only way to tell if you are in true labor is for your health care provider to look for changes in the cervix. The health care provider will do a physical exam and may monitor your contractions. If you   are not in true labor, the exam  should show that your cervix is not dilating and your water has not broken. If there are no prenatal problems or other health problems associated with your pregnancy, it is completely safe for you to be sent home with false labor. You may continue to have Braxton Hicks contractions until you go into true labor. How can I tell the difference between true labor and false labor?  Differences  False labor  Contractions last 30-70 seconds.: Contractions are usually shorter and not as strong as true labor contractions.  Contractions become very regular.: Contractions are usually irregular.  Discomfort is usually felt in the top of the uterus, and it spreads to the lower abdomen and low back.: Contractions are often felt in the front of the lower abdomen and in the groin.  Contractions do not go away with walking.: Contractions may go away when you walk around or change positions while lying down.  Contractions usually become more intense and increase in frequency.: Contractions get weaker and are shorter-lasting as time goes on.  The cervix dilates and gets thinner.: The cervix usually does not dilate or become thin. Follow these instructions at home:  Take over-the-counter and prescription medicines only as told by your health care provider.  Keep up with your usual exercises and follow other instructions from your health care provider.  Eat and drink lightly if you think you are going into labor.  If Braxton Hicks contractions are making you uncomfortable:  Change your position from lying down or resting to walking, or change from walking to resting.  Sit and rest in a tub of warm water.  Drink enough fluid to keep your urine clear or pale yellow. Dehydration may cause these contractions.  Do slow and deep breathing several times an hour.  Keep all follow-up prenatal visits as told by your health care provider. This is important. Contact a health care provider if:  You have a  fever.  You have continuous pain in your abdomen. Get help right away if:  Your contractions become stronger, more regular, and closer together.  You have fluid leaking or gushing from your vagina.  You pass blood-tinged mucus (bloody show).  You have bleeding from your vagina.  You have low back pain that you never had before.  You feel your baby's head pushing down and causing pelvic pressure.  Your baby is not moving inside you as much as it used to. Summary  Contractions that occur before labor are called Braxton Hicks contractions, false labor, or practice contractions.  Braxton Hicks contractions are usually shorter, weaker, farther apart, and less regular than true labor contractions. True labor contractions usually become progressively stronger and regular and they become more frequent.  Manage discomfort from Braxton Hicks contractions by changing position, resting in a warm bath, drinking plenty of water, or practicing deep breathing. This information is not intended to replace advice given to you by your health care provider. Make sure you discuss any questions you have with your health care provider. Document Released: 01/13/2005 Document Revised: 12/03/2015 Document Reviewed: 12/03/2015 Elsevier Interactive Patient Education  2017 [employer]  

## 2016-06-13 ENCOUNTER — Other Ambulatory Visit: Payer: Self-pay | Admitting: Advanced Practice Midwife

## 2016-06-15 ENCOUNTER — Inpatient Hospital Stay (HOSPITAL_COMMUNITY)
Admission: AD | Admit: 2016-06-15 | Discharge: 2016-06-17 | DRG: 775 | Disposition: A | Discharge status: Home / Self Care | Payer: Medicaid Other | Source: Ambulatory Visit | Attending: Obstetrics & Gynecology | Admitting: Obstetrics & Gynecology

## 2016-06-15 ENCOUNTER — Encounter (HOSPITAL_COMMUNITY): Payer: Self-pay | Admitting: *Deleted

## 2016-06-15 DIAGNOSIS — O480 Post-term pregnancy: Secondary | ICD-10-CM | POA: Diagnosis present

## 2016-06-15 DIAGNOSIS — O99214 Obesity complicating childbirth: Secondary | ICD-10-CM | POA: Diagnosis present

## 2016-06-15 DIAGNOSIS — Z68.41 Body mass index (BMI) pediatric, 5th percentile to less than 85th percentile for age: Secondary | ICD-10-CM

## 2016-06-15 DIAGNOSIS — Z3A40 40 weeks gestation of pregnancy: Secondary | ICD-10-CM

## 2016-06-15 DIAGNOSIS — O0933 Supervision of pregnancy with insufficient antenatal care, third trimester: Secondary | ICD-10-CM

## 2016-06-15 DIAGNOSIS — Z3493 Encounter for supervision of normal pregnancy, unspecified, third trimester: Secondary | ICD-10-CM

## 2016-06-15 DIAGNOSIS — O368390 Maternal care for abnormalities of the fetal heart rate or rhythm, unspecified trimester, not applicable or unspecified: Secondary | ICD-10-CM | POA: Diagnosis present

## 2016-06-15 DIAGNOSIS — Z3400 Encounter for supervision of normal first pregnancy, unspecified trimester: Secondary | ICD-10-CM

## 2016-06-15 HISTORY — DX: Other specified health status: Z78.9

## 2016-06-15 LAB — TYPE AND SCREEN
ABO/RH(D): O POS
ANTIBODY SCREEN: NEGATIVE

## 2016-06-15 LAB — CBC
HEMATOCRIT: 33.7 % — AB (ref 36.0–46.0)
Hemoglobin: 10.9 g/dL — ABNORMAL LOW (ref 12.0–15.0)
MCH: 26.8 pg (ref 26.0–34.0)
MCHC: 32.3 g/dL (ref 30.0–36.0)
MCV: 82.8 fL (ref 78.0–100.0)
Platelets: 465 10*3/uL — ABNORMAL HIGH (ref 150–400)
RBC: 4.07 MIL/uL (ref 3.87–5.11)
RDW: 16.4 % — ABNORMAL HIGH (ref 11.5–15.5)
WBC: 12.1 10*3/uL — ABNORMAL HIGH (ref 4.0–10.5)

## 2016-06-15 LAB — ABO/RH: ABO/RH(D): O POS

## 2016-06-15 MED ORDER — OXYTOCIN BOLUS FROM INFUSION
500.0000 mL | Freq: Once | INTRAVENOUS | Status: AC
  Administered 2016-06-16: 500 mL via INTRAVENOUS

## 2016-06-15 MED ORDER — OXYTOCIN 40 UNITS IN LACTATED RINGERS INFUSION - SIMPLE MED
1.0000 m[IU]/min | INTRAVENOUS | Status: DC
  Administered 2016-06-15: 2 m[IU]/min via INTRAVENOUS
  Administered 2016-06-16: 10 m[IU]/min via INTRAVENOUS
  Filled 2016-06-15: qty 1000

## 2016-06-15 MED ORDER — HYDROXYZINE HCL 50 MG PO TABS
50.0000 mg | ORAL_TABLET | Freq: Four times a day (QID) | ORAL | Status: DC | PRN
  Filled 2016-06-15: qty 1

## 2016-06-15 MED ORDER — OXYCODONE-ACETAMINOPHEN 5-325 MG PO TABS: 1.0000 | ORAL_TABLET | ORAL | Status: DC | PRN

## 2016-06-15 MED ORDER — LACTATED RINGERS IV SOLN
INTRAVENOUS | Status: DC
  Administered 2016-06-16 (×2): via INTRAVENOUS

## 2016-06-15 MED ORDER — OXYCODONE-ACETAMINOPHEN 5-325 MG PO TABS: 2.0000 | ORAL_TABLET | ORAL | Status: DC | PRN

## 2016-06-15 MED ORDER — SODIUM CHLORIDE 0.9% FLUSH: 3.0000 mL | Freq: Two times a day (BID) | INTRAVENOUS | Status: DC

## 2016-06-15 MED ORDER — TERBUTALINE SULFATE 1 MG/ML IJ SOLN
0.2500 mg | Freq: Once | INTRAMUSCULAR | Status: DC | PRN
  Filled 2016-06-15: qty 1

## 2016-06-15 MED ORDER — LIDOCAINE HCL (PF) 1 % IJ SOLN
30.0000 mL | INTRAMUSCULAR | Status: DC | PRN
  Filled 2016-06-15: qty 30

## 2016-06-15 MED ORDER — PROMETHAZINE HCL 25 MG/ML IJ SOLN
12.5000 mg | INTRAMUSCULAR | Status: DC | PRN
  Administered 2016-06-15 – 2016-06-16 (×2): 12.5 mg via INTRAVENOUS
  Filled 2016-06-15: qty 1

## 2016-06-15 MED ORDER — SODIUM CHLORIDE 0.9% FLUSH: 3.0000 mL | INTRAVENOUS | Status: DC | PRN

## 2016-06-15 MED ORDER — OXYTOCIN 40 UNITS IN LACTATED RINGERS INFUSION - SIMPLE MED: 2.5000 [IU]/h | INTRAVENOUS | Status: DC

## 2016-06-15 MED ORDER — NALBUPHINE HCL 10 MG/ML IJ SOLN
5.0000 mg | INTRAMUSCULAR | Status: DC | PRN
  Administered 2016-06-15 – 2016-06-16 (×2): 5 mg via INTRAVENOUS
  Filled 2016-06-15 (×2): qty 1

## 2016-06-15 MED ORDER — LACTATED RINGERS IV SOLN: 500.0000 mL | INTRAVENOUS | Status: DC | PRN

## 2016-06-15 MED ORDER — SOD CITRATE-CITRIC ACID 500-334 MG/5ML PO SOLN: 30.0000 mL | ORAL | Status: DC | PRN

## 2016-06-15 MED ORDER — OXYTOCIN 10 UNIT/ML IJ SOLN: 10.0000 [IU] | Freq: Once | INTRAMUSCULAR | Status: DC

## 2016-06-15 MED ORDER — ONDANSETRON HCL 4 MG/2ML IJ SOLN: 4.0000 mg | Freq: Four times a day (QID) | INTRAMUSCULAR | Status: DC | PRN

## 2016-06-15 MED ORDER — FENTANYL CITRATE (PF) 100 MCG/2ML IJ SOLN
50.0000 ug | INTRAMUSCULAR | Status: DC | PRN
  Administered 2016-06-15: 100 ug via INTRAVENOUS
  Filled 2016-06-15: qty 2

## 2016-06-15 MED ORDER — SODIUM CHLORIDE 0.9 % IV SOLN: 250.0000 mL | INTRAVENOUS | Status: DC | PRN

## 2016-06-15 MED ORDER — ACETAMINOPHEN 325 MG PO TABS: 650.0000 mg | ORAL_TABLET | ORAL | Status: DC | PRN

## 2016-06-15 NOTE — Anesthesia Pain Management Evaluation Note (Signed)
  CRNA Pain Management Visit Note  Patient: Christina Mann, 21 y.o., female  "Hello [facility] am [facility]. We have an anesthesia team available at all times to provide care throughout the hospital, including epidural management and anesthesia for C-section. I don't know your plan for the delivery whether it a natural birth, water birth, IV sedation, nitrous supplementation, doula or epidural, but we want to meet your pain goals."   1.Was your pain managed to your expectations on prior hospitalizations?   No prior hospitalizations  2.What is your expectation for pain management during this hospitalization?     Epidural  3.How can we help you reach that goal? Epidural   Record the patient's initial score and the patient's pain goal.   Pain: 3  Pain Goal: 6 [facility] wants you to be able to say your pain was always managed very well.  [name] 06/15/2016

## 2016-06-15 NOTE — Progress Notes (Signed)
Patient ID: [name], female   DOB: 25-Apr-1995, 21 y.o.   MRN: [id number] Starting to feel more pain  Vitals:   06/15/16 1829 06/15/16 1913 06/15/16 1923 06/15/16 2109  BP:  108/60 114/60 120/70  Pulse:  83 83 90  Resp:  16 17   Temp: 98.5 F (36.9 C)     TempSrc: Oral     SpO2:      Weight:      Height:       FHR reactive UCs irregular  Dilation: 1 Effacement (%): 80 Cervical Position: Posterior Station: -3 Presentation: Vertex Exam by:: Shaune LeeksM. Lawson, CNM   Wants analgesia Will start Fentanyl IV prn  Discussed foley, will think about it, does not consent yet

## 2016-06-15 NOTE — Progress Notes (Signed)
Christina Mann. Lawson CNM informed of FHR deceleration, SVE 1/thick - order for U/S received.

## 2016-06-15 NOTE — MAU Note (Signed)
Pt reports contractions, denies bleeding or ROM.  

## 2016-06-15 NOTE — H&P (Signed)
Christina Mann is a 21 y.o. female presenting for labor eval not in labor but having spont variable decels on admit to [facility]U OB History    Gravida Para Term Preterm AB Living   1             SAB TAB Ectopic Multiple Live Births                 Past Medical History:  Diagnosis Date  . Medical history non-contributory    Past Surgical History:  Procedure Laterality Date  . NO PAST SURGERIES     Family History: family history is not on file. Social History:  reports that she has never smoked. She has never used smokeless tobacco. She reports that she does not drink alcohol or use drugs.     Maternal Diabetes: No Genetic Screening: Normal Maternal Ultrasounds/Referrals: Normal Fetal Ultrasounds or other Referrals:  None Maternal Substance Abuse:  No Significant Maternal Medications:  None Significant Maternal Lab Results:  None Other Comments:  None  Review of Systems  Constitutional: Negative.   HENT: Negative.   Eyes: Negative.   Respiratory: Negative.   Cardiovascular: Negative.   Gastrointestinal: Positive for abdominal pain.  Genitourinary: Negative.   Musculoskeletal: Negative.   Skin: Negative.   Neurological: Negative.   Endo/Heme/Allergies: Negative.    Maternal Medical History:  Reason for admission: Contractions.   Contractions: Onset was 6-12 hours ago.   Frequency: irregular.   Perceived severity is mild.    Fetal activity: Perceived fetal activity is normal.   Last perceived fetal movement was within the past hour.    Prenatal complications: no prenatal complications Prenatal Complications - Diabetes: none.    Dilation: 1 Effacement (%): Thick Station: -3 Exam by:: Dorrene German RN Blood pressure 119/71, pulse (!) 101, temperature 98.7 F (37.1 C), temperature source Oral, resp. rate 16, height 5\' 7"  (1.702 m), weight 256 lb (116.1 kg), SpO2 100 %. Maternal Exam:  Uterine Assessment: Contraction strength is mild.  Contraction frequency is  irregular.   Abdomen: Patient reports no abdominal tenderness. Estimated fetal weight is 7lbs.   Fetal presentation: vertex  Introitus: Normal vulva. Normal vagina.  Amniotic fluid character: not assessed.  Pelvis: adequate for delivery.   Cervix: Cervix evaluated by digital exam.     Fetal Exam Fetal Monitor Review: Mode: ultrasound.   Variability: moderate (6-25 bpm).   Pattern: variable decelerations.    Fetal State Assessment: Category II - tracings are indeterminate.     Physical Exam  Constitutional: She is oriented to person, place, and time. She appears well-developed and well-nourished.  HENT:  Head: Normocephalic.  Eyes: Pupils are equal, round, and reactive to light.  Neck: Normal range of motion.  Cardiovascular: Normal rate, normal heart sounds and intact distal pulses.   Respiratory: Effort normal and breath sounds normal.  GI: Soft. Bowel sounds are normal.  Genitourinary: Vagina normal and uterus normal.  Musculoskeletal: Normal range of motion.  Neurological: She is alert and oriented to person, place, and time. She has normal reflexes.  Skin: Skin is warm and dry.  Psychiatric: She has a normal mood and affect. Her behavior is normal. Judgment and thought content normal.    Prenatal labs: ABO, Rh: O/POS/-- (02/15 1112) Antibody: NEG (02/15 1112) Rubella: 16.50 (02/15 1112) RPR: Non Reactive (03/14 1644)  HBsAg: NEGATIVE (02/15 1112)  HIV: Non Reactive (03/14 1644)  GBS: Negative (04/19 1412)   Assessment/Plan: variable decels if FHR  Admit Pitocin induction of labor  Wyvonnia DuskyMarie Lawson 06/15/2016, 5:35 PM

## 2016-06-16 ENCOUNTER — Other Ambulatory Visit: Payer: Self-pay | Admitting: Medical

## 2016-06-16 ENCOUNTER — Inpatient Hospital Stay (HOSPITAL_COMMUNITY): Payer: Medicaid Other | Admitting: Anesthesiology

## 2016-06-16 ENCOUNTER — Encounter (HOSPITAL_COMMUNITY): Payer: Self-pay

## 2016-06-16 DIAGNOSIS — Z3A40 40 weeks gestation of pregnancy: Secondary | ICD-10-CM

## 2016-06-16 LAB — RPR: RPR Ser Ql: NONREACTIVE

## 2016-06-16 MED ORDER — SIMETHICONE 80 MG PO CHEW: 80.0000 mg | CHEWABLE_TABLET | ORAL | Status: DC | PRN

## 2016-06-16 MED ORDER — DIBUCAINE 1 % RE OINT: 1.0000 "application " | TOPICAL_OINTMENT | RECTAL | Status: DC | PRN

## 2016-06-16 MED ORDER — DIPHENHYDRAMINE HCL 50 MG/ML IJ SOLN: 12.5000 mg | INTRAMUSCULAR | Status: DC | PRN

## 2016-06-16 MED ORDER — COCONUT OIL OIL: 1.0000 "application " | TOPICAL_OIL | Status: DC | PRN

## 2016-06-16 MED ORDER — PRENATAL PLUS 27-1 MG PO TABS
1.0000 | ORAL_TABLET | Freq: Every day | ORAL | Status: DC
  Filled 2016-06-16: qty 1

## 2016-06-16 MED ORDER — NALBUPHINE HCL 10 MG/ML IJ SOLN
5.0000 mg | Freq: Once | INTRAMUSCULAR | Status: AC
  Administered 2016-06-16: 5 mg via INTRAMUSCULAR
  Filled 2016-06-16: qty 1

## 2016-06-16 MED ORDER — LACTATED RINGERS IV SOLN: INTRAVENOUS | Status: DC

## 2016-06-16 MED ORDER — LACTATED RINGERS IV SOLN: 500.0000 mL | Freq: Once | INTRAVENOUS | Status: DC

## 2016-06-16 MED ORDER — WITCH HAZEL-GLYCERIN EX PADS: 1.0000 "application " | MEDICATED_PAD | CUTANEOUS | Status: DC | PRN

## 2016-06-16 MED ORDER — DIPHENHYDRAMINE HCL 25 MG PO CAPS: 25.0000 mg | ORAL_CAPSULE | Freq: Four times a day (QID) | ORAL | Status: DC | PRN

## 2016-06-16 MED ORDER — TETANUS-DIPHTH-ACELL PERTUSSIS 5-2.5-18.5 LF-MCG/0.5 IM SUSP: 0.5000 mL | Freq: Once | INTRAMUSCULAR | Status: DC

## 2016-06-16 MED ORDER — ZOLPIDEM TARTRATE 5 MG PO TABS: 5.0000 mg | ORAL_TABLET | Freq: Every evening | ORAL | Status: DC | PRN

## 2016-06-16 MED ORDER — EPHEDRINE 5 MG/ML INJ
10.0000 mg | INTRAVENOUS | Status: DC | PRN
Start: 2016-06-16 — End: 2016-06-16
  Filled 2016-06-16: qty 2

## 2016-06-16 MED ORDER — SODIUM BICARBONATE 8.4 % IV SOLN
INTRAVENOUS | Status: DC | PRN
  Administered 2016-06-16: 5 mL via EPIDURAL

## 2016-06-16 MED ORDER — EPHEDRINE 5 MG/ML INJ
10.0000 mg | INTRAVENOUS | Status: DC | PRN
  Filled 2016-06-16: qty 2

## 2016-06-16 MED ORDER — FENTANYL 2.5 MCG/ML BUPIVACAINE 1/10 % EPIDURAL INFUSION (WH - ANES)
14.0000 mL/h | INTRAMUSCULAR | Status: DC | PRN
  Administered 2016-06-16 (×2): 14 mL/h via EPIDURAL
  Filled 2016-06-16 (×2): qty 100

## 2016-06-16 MED ORDER — PHENYLEPHRINE 40 MCG/ML (10ML) SYRINGE FOR IV PUSH (FOR BLOOD PRESSURE SUPPORT)
80.0000 ug | PREFILLED_SYRINGE | INTRAVENOUS | Status: DC | PRN
Start: 2016-06-16 — End: 2016-06-16
  Filled 2016-06-16: qty 5
  Filled 2016-06-16 (×2): qty 10

## 2016-06-16 MED ORDER — IBUPROFEN 600 MG PO TABS
600.0000 mg | ORAL_TABLET | Freq: Four times a day (QID) | ORAL | Status: DC
  Administered 2016-06-16 – 2016-06-17 (×4): 600 mg via ORAL
  Filled 2016-06-16 (×4): qty 1

## 2016-06-16 MED ORDER — LIDOCAINE HCL (PF) 1 % IJ SOLN
INTRAMUSCULAR | Status: DC | PRN
  Administered 2016-06-16: 4 mL
  Administered 2016-06-16: 6 mL via EPIDURAL

## 2016-06-16 MED ORDER — ONDANSETRON HCL 4 MG/2ML IJ SOLN: 4.0000 mg | INTRAMUSCULAR | Status: DC | PRN

## 2016-06-16 MED ORDER — ACETAMINOPHEN 325 MG PO TABS: 650.0000 mg | ORAL_TABLET | ORAL | Status: DC | PRN

## 2016-06-16 MED ORDER — PHENYLEPHRINE 40 MCG/ML (10ML) SYRINGE FOR IV PUSH (FOR BLOOD PRESSURE SUPPORT)
80.0000 ug | PREFILLED_SYRINGE | INTRAVENOUS | Status: DC | PRN
  Filled 2016-06-16: qty 5

## 2016-06-16 MED ORDER — ONDANSETRON HCL 4 MG PO TABS: 4.0000 mg | ORAL_TABLET | ORAL | Status: DC | PRN

## 2016-06-16 MED ORDER — SENNOSIDES-DOCUSATE SODIUM 8.6-50 MG PO TABS: 2.0000 | ORAL_TABLET | ORAL | Status: DC

## 2016-06-16 MED ORDER — BENZOCAINE-MENTHOL 20-0.5 % EX AERO: 1.0000 "application " | INHALATION_SPRAY | CUTANEOUS | Status: DC | PRN

## 2016-06-16 NOTE — Progress Notes (Signed)
Subjective: Patient still reporting 10/10 pain with contractions, sleeping in between.   Objective: Vitals:   06/15/16 2134 06/15/16 2356 06/16/16 0332 06/16/16 0531  BP:   112/61   Pulse:  99 95 (!) 106  Resp:   17   Temp: 98.2 F (36.8 C)  98.5 F (36.9 C)   TempSrc: Oral  Oral   SpO2:  99%  98%  Weight:      Height:       No intake/output data recorded.  FHT:  FHR: 130 bpm, variability: moderate,  accelerations:  Present,  decelerations:  Present variable UC:   irregular, every 5-8 minutes SVE:   Dilation: 1.5 Effacement (%): 80 Station: -1 Exam by:: Jerald Kiefaroline N, CNM-Student  Pitocin @ 8 mu/min  Labs: Lab Results  Component Value Date   WBC 12.1 (H) 06/15/2016   HGB 10.9 (L) 06/15/2016   HCT 33.7 (L) 06/15/2016   MCV 82.8 06/15/2016   PLT 465 (H) 06/15/2016    Assessment / Plan: IUP at term. IOL for spontaneous variable decelerations post term. GBS neg.  Patient agreeable to cervical exam. Cervix essentially unchanged over 9 hours. Offered patient foley bulb, patient refused, states she "doesn't want to feel any cramping." Patient wants epidural. Discussed with Dr. Despina HiddenEure and MD ok with epidural placement conditional upon foley placement after.  Christina Mann SNM 06/16/2016, 5:56 AM

## 2016-06-16 NOTE — Progress Notes (Signed)
S: Patient seen & examined for progress of labor. Patient comfortable with epidural.    O:  Vitals:   06/16/16 1455 06/16/16 1530 06/16/16 1605 06/16/16 1700  BP: (!) 134/98 123/68 114/69   Pulse:  88 (!) 109   Resp: 20   16  Temp:      TempSrc:      SpO2:      Weight:      Height:        Dilation: 8 Effacement (%): 80 Cervical Position: Middle Station: -2 Presentation: Vertex Exam by:: sowder   FHT: 130 bpm, mod var, +accels, +early decels TOCO: Irregular   A/P:  Continue expectant management Anticipate SVD

## 2016-06-16 NOTE — Anesthesia Preprocedure Evaluation (Signed)

## 2016-06-16 NOTE — Progress Notes (Signed)
Patient ID: Christina Mann, female   DOB: 1996-01-11, 21 y.o.   MRN: [id number] Sleeping at intervals after pain med  Vitals:   06/15/16 1923 06/15/16 2109 06/15/16 2134 06/15/16 2356  BP: 114/60 120/70    Pulse: 83 90  99  Resp: 17 20    Temp:   98.2 F (36.8 C)   TempSrc:   Oral   SpO2:    99%  Weight:      Height:       FHR nonreactive, likely due to med UCs every 5-6 min  Refuses cervical exam  Recommended foley balloon, patient refuses.  Discussed this will likely lengthen labor, patient accepts  Will continue Pitocin

## 2016-06-16 NOTE — Progress Notes (Signed)
Initial visit with the adoptive family Romie JumperChelsey has chosen for her baby.  Kennyth ArnoldStacy and her husband are eager to meet their daughter, but appropriately anxious given their inability to be fully involved with the birth process.  They know that the birth mother would like to keep the baby with her for an extended period of time after delivery and are curious about what it will be like to visit Christina Mann (the name they've chosen).  I assured them that we will do what we can do accomodate their needs and give them a private place to get to know their baby.  They questioned whether they would have a separate room once the baby has been released to them.  They are nervous about her being discharged as soon as the baby has been separated from her mother and hope to have some prolonged time at the hospital while the baby is still a patient to help them begin to learn how to care for her with the support and education of the nursing staff.  [place] here from the [place] and have good family support-both sets of grandparents are here as well.  Please page as further needs arise.  Maryanna ShapeAmanda M. Carley Hammedavee Lomax, M.Div. [facility] Chaplain Pager [phone] Office [phone]

## 2016-06-16 NOTE — Progress Notes (Signed)
S: Patient seen & examined for progress of labor. Patient resting comfortably. No current complaints.  O:  Vitals:   06/16/16 1100 06/16/16 1130 06/16/16 1200 06/16/16 1230  BP: (!) 120/95 121/60 127/69 (!) 149/87  Pulse: 96 (!) 106 88 94  Resp:  18 18 18   Temp:      TempSrc:      SpO2:      Weight:      Height:        Dilation: 8 Effacement (%): 80 Cervical Position: Middle Station: -1 Presentation: Vertex Exam by:: Joni ReiningNicole Jones/Anita sowder   FHT: 150 bpm, mod var, +accels, +variabledecels TOCO: variable   A/P:  21 year old G1P0 presents for IOL due to variable decels, currently with some variable deceleration.  Category II tracing

## 2016-06-16 NOTE — Progress Notes (Signed)
Patient's foley bulb has fallen out. Cervix 5/80/-2. patient with some variable deceleration. AROM clear, IUPC placed and will start amnioinfusion.  Category 2 tracing.  Ernestina PennaNicholas Kaj Vasil, MD 06/16/16 10:49 AM

## 2016-06-16 NOTE — Progress Notes (Signed)
Patient plans to have adoptive parents present after delivery to see the baby for a short period of time. Patient desires baby stay with her over night for 24hrs then adoptive parents can take the baby. Social worker Tobi Bastosnna will be at delivery, can be reached at [phone].

## 2016-06-16 NOTE — Anesthesia Procedure Notes (Signed)
Epidural Patient location during procedure: OB  Staffing Anesthesiologist: Kimbree Casanas  Preanesthetic Checklist Completed: patient identified, site marked, surgical consent, pre-op evaluation, timeout performed, IV checked, risks and benefits discussed and monitors and equipment checked  Epidural Patient position: sitting Prep: site prepped and draped and DuraPrep Patient monitoring: continuous pulse ox and blood pressure Approach: midline Location: L3-L4 Injection technique: LOR air  Needle:  Needle type: Tuohy  Needle gauge: 17 G Needle length: 9 cm and 9 Needle insertion depth: 7 cm Catheter type: closed end flexible Catheter size: 19 Gauge Catheter at skin depth: 15 cm Test dose: negative  Assessment Events: blood not aspirated, injection not painful, no injection resistance, negative IV test and no paresthesia  Additional Notes Dosing of Epidural:  1st dose, through catheter .............................................  Xylocaine 40 mg  2nd dose, through catheter, after waiting 3 minutes.........Xylocaine 60 mg    As each dose occurred, patient was free of IV sx; and patient exhibited no evidence of SA injection.  Patient is more comfortable after epidural dosed. Please see RN's note for documentation of vital signs,and FHR which are stable.  Patient reminded not to try to ambulate with numb legs, and that an RN must be present when she attempts to get up.        

## 2016-06-16 NOTE — Progress Notes (Signed)
CSW received call from L&D RN stating that adoptive parents are here and have questions about how they will be able to bond with baby and if they will have private space with baby to do so since MOB plans to keep baby with her for the first 24 hours.  CSW asked that adoptive parents be informed that they will be offered a room to room in with baby once MOB signs paperwork, but will not extend past baby's medical clearance for discharge and is based on census/availability.  CSW acknowledges that this is an anxiety provoking time for them, but that we will follow the birth mother's wishes until paperwork is signed.  Any contact that the adoptive parents have before that time must be at MOB's agreement.  CSW will follow up with birth mother and adoptive parents (if possible) on 06/17/16.

## 2016-06-16 NOTE — Progress Notes (Signed)
Patient ID: Christina Mann, female   DOB: 1995/06/11, 21 y.o.   MRN: [id number] Vitals:   06/16/16 0531 06/16/16 0702 06/16/16 0705 06/16/16 0711  BP:  126/80 106/68 127/81  Pulse: (!) 106 91 (!) 130 85  Resp:  17    Temp:      TempSrc:      SpO2: 98% 98%  98%  Weight:      Height:       Pitocin at 426mu/min for ripening  Has been refusing Foley bulb due to fear of pain Discussed with Dr Despina HiddenEure He authorized epidural (pt request) but only if she agrees to Foley  She reluctantly agreed  Will proceed

## 2016-06-16 NOTE — Progress Notes (Signed)
Patient seen. Doing well. No complaints. Plans for [facility]FA. Epidural in place. Tolerated foley placement well. Continue pitocin.   Category 1 tracing  Cervix 1.5/80/-3  Ernestina PennaNicholas Schenk, MD 06/16/16 9:21 AM

## 2016-06-17 MED ORDER — IBUPROFEN 600 MG PO TABS: 600.0000 mg | ORAL_TABLET | Freq: Four times a day (QID) | ORAL | 0 refills | Status: DC

## 2016-06-17 MED ORDER — SENNOSIDES-DOCUSATE SODIUM 8.6-50 MG PO TABS: 1.0000 | ORAL_TABLET | Freq: Every evening | ORAL | Status: DC | PRN

## 2016-06-17 MED ORDER — PRENATAL PLUS 27-1 MG PO TABS: 1.0000 | ORAL_TABLET | Freq: Every day | ORAL | 0 refills | Status: DC

## 2016-06-17 NOTE — Discharge Summary (Signed)
OB Discharge Summary     Patient Name: Christina Mann DOB: Dec 03, 1995 MRN: [id number]  Date of admission: 06/15/2016 Delivering MD: Lorne Skeens   Date of discharge: 06/17/2016  Admitting diagnosis: 40 WKS, CTXS Intrauterine pregnancy: [redacted]w[redacted]d     Secondary diagnosis:  Active Problems:   Fetal heart rate/rhythm abnormality affecting management of mother  Additional problems:  Patient Active Problem List   Diagnosis Date Noted  . Fetal heart rate/rhythm abnormality affecting management of mother 06/15/2016  . Pregnancy with adoption planned, currently in third trimester 04/28/2016  . Supervision of normal first pregnancy, antepartum 03/13/2016  . Insufficient prenatal care, third trimester 03/13/2016        Discharge diagnosis: Term Pregnancy Delivered                                                                                                Post partum procedures:None  Augmentation: Pitocin and Foley Balloon  Complications: None  Hospital course:  Onset of Labor With Vaginal Delivery     21 y.o. yo G1P1001 at [redacted]w[redacted]d was admitted due to variable decelerations of FHR on 06/15/2016. Patient had an uncomplicated labor course as follows:  Membrane Rupture Time/Date: 10:37 AM ,06/16/2016   Intrapartum Procedures: Episiotomy: None [1]                                         Lacerations:  None [1]  Patient had a delivery of a Viable infant. 06/16/2016  Information for the patient's newborn:  Jillann, Charette [[id number]]  Delivery Method: Vaginal, Spontaneous Delivery (Filed from Delivery Summary)    Pateint had an uncomplicated postpartum course.  She is ambulating, tolerating a regular diet, passing flatus, and urinating well. Patient is discharged home in stable condition on 06/17/16.   Physical exam  Vitals:   06/16/16 2150 06/16/16 2215 06/16/16 2315 06/17/16 0315  BP: 126/77 123/73 128/79 108/69  Pulse: 90 95 (!) 104 80  Resp:  18 18 18   Temp:   98.8 F (37.1 C) 98.4 F (36.9 C) 98.2 F (36.8 C)  TempSrc:  Oral Oral Oral  SpO2:  99% 99% 99%  Weight:      Height:       General: alert, cooperative and no distress Lochia: appropriate Uterine Fundus: firm Incision: N/A DVT Evaluation: No evidence of DVT seen on physical exam. Labs: Lab Results  Component Value Date   WBC 12.1 (H) 06/15/2016   HGB 10.9 (L) 06/15/2016   HCT 33.7 (L) 06/15/2016   MCV 82.8 06/15/2016   PLT 465 (H) 06/15/2016   CMP Latest Ref Rng & Units 03/03/2016  Glucose 65 - 99 mg/dL 469(G)  BUN 6 - 20 mg/dL 5(L)  Creatinine 2.95 - 1.00 mg/dL 2.84  Sodium 132 - 440 mmol/L 138  Potassium 3.5 - 5.1 mmol/L 3.7  Chloride 101 - 111 mmol/L 107  CO2 22 - 32 mmol/L 23  Calcium 8.9 - 10.3 mg/dL 9.4  Total Protein 6.5 - 8.1 g/dL 6.8  Total Bilirubin 0.3 - 1.2 mg/dL 0.5  Alkaline Phos 38 - 126 U/L 69  AST 15 - 41 U/L 22  ALT 14 - 54 U/L 19    Discharge instruction: per After Visit Summary and "Baby and Me Booklet".  After visit meds:  Allergies as of 06/17/2016   No Known Allergies     Medication List    STOP taking these medications   cephALEXin 500 MG capsule Commonly known as:  KEFLEX     TAKE these medications   ibuprofen 600 MG tablet Commonly known as:  ADVIL,MOTRIN Take 1 tablet (600 mg total) by mouth every 6 (six) hours.   prenatal vitamin w/FE, FA 27-1 MG Tabs tablet Take 1 tablet by mouth daily.   senna-docusate 8.6-50 MG tablet Commonly known as:  Senokot-S Take 1 tablet by mouth at bedtime as needed for mild constipation.       Diet: routine diet  Activity: Advance as tolerated. Pelvic rest for 6 weeks.   Outpatient follow up:6 weeks Follow up Appt:No future appointments. Follow up Visit:No Follow-up on file.  Postpartum contraception: IUD to be done outpatient  Newborn Data: Live born female  Birth Weight: 7 lb 0.5 oz (3189 g) APGAR: 9, 9  Baby Feeding: baby up for  adoption Disposition:adoption   06/17/2016 Howard PouchLauren Jordann Grime, MD

## 2016-06-17 NOTE — Discharge Instructions (Signed)

## 2016-06-17 NOTE — Progress Notes (Signed)
Pt given d/c education and discharge paperwork, pt verbalizes understanding.  Pt refused wheelchair ride to car.  Pt being taken home by adoption agency staff.  Pt is emotionally upset but states she is coping through the difficult emotions and has no other needs at this time.

## 2016-06-17 NOTE — Progress Notes (Signed)
POSTPARTUM PROGRESS NOTE  Post Partum Day #1  Subjective:  Eula ListenChelsey R Kyllo is a 21 y.o. G1P1001 6226w4d s/p NSVD.  No acute events overnight.  Pt denies problems with ambulating, voiding or po intake.  She denies nausea or vomiting.  Pain is well controlled.  She has had flatus. She has had bowel movement.  Lochia Small.   Objective: Blood pressure 108/69, pulse 80, temperature 98.2 F (36.8 C), temperature source Oral, resp. rate 18, height 5\' 7"  (1.702 m), weight 256 lb (116.1 kg), SpO2 99 %, unknown if currently breastfeeding.  Physical Exam:  General: alert, cooperative and no distress Lochia:normal flow Chest: CTAB Heart: RRR no m/r/g Abdomen: +BS, soft, nontender Uterine Fundus: firm, below umbilicus DVT Evaluation: No calf swelling or tenderness Extremities: +bilateral pedal edema   Recent Labs  06/15/16 1820  HGB 10.9*  HCT 33.7*    Assessment/Plan:  ASSESSMENT: Maralyn R Cozby is a 21 y.o. G1P1001 6926w4d day #1 s/p NSVD.  Consider DC home later today.   LOS: 2 days   Howard PouchLauren Erika Slaby, MD PGY-1 [facility]  06/17/2016, 7:48 AM

## 2016-06-17 NOTE — Progress Notes (Signed)
CSW met with birth mother in her first floor room/140 to offer support and discuss her plans for adoption.  MOB reports that she is exhausted, but welcomed CSW into her room and stated that we could talk now.  MOB reports that she feels confident in her decision for adoption "as long as she (baby) is going with them (adoptive parents she has chosen)."  CSW assured MOB that baby will discharge to the parents she has chosen once she signs the adoption paperwork.  She reports that adoption laws have been explained to her and she understands that she has 7 days to revoke if she changes her mind.  She states she has known for the majority of her pregnancy that this is the decision she was planning to make.   CSW asked who MOB's supports are and how she is feeling emotionally.  MOB reports that her best friend is her greatest support and was here with her for delivery.  She declined that she has any additional support people and reports that she lives by herself.  She states she can call her friend any time and acknowledges that there is a mix of emotions she is feeling right now, but did not elaborate on them.  She states she feels supported by the adoption agency [facility] and reports that they have discussed follow up counseling.   CSW spoke with adoptive parents who state they are doing well.  Per Phyllis Stephenson/Adoption Agency Representative, MOB plans to sign paperwork this afternoon after she has had time to rest.  Ms. Stephenson will provide CSW with a copy of the paperwork for baby's chart and then baby can be in the care of adoptive parents over night.  Baby is with adoptive parents at this time with MOB's consent. 

## 2016-06-17 NOTE — Progress Notes (Signed)
CSW has obtained signed adoption paperwork and identifies no barriers to MOB's discharge at this time.

## 2016-06-17 NOTE — Plan of Care (Signed)
Problem: Role Relationship: Goal: Ability to demonstrate positive interaction with newborn will improve Outcome: Progressing Mom has requested that infant stay in the room for 48 hours with her rather than just the first 24 hours; mom hesitant to care for infant and repeatedly calls out for staff to feed and change infant.

## 2016-06-17 NOTE — Anesthesia Postprocedure Evaluation (Signed)
Anesthesia Post Note  Patient: Christina Mann  Procedure(s) Performed: * No procedures listed *  Patient location during evaluation: Mother Baby Anesthesia Type: Epidural Level of consciousness: awake and alert and oriented Pain management: satisfactory to patient Vital Signs Assessment: post-procedure vital signs reviewed and stable Respiratory status: spontaneous breathing and nonlabored ventilation Cardiovascular status: stable Postop Assessment: no headache, no backache, no signs of nausea or vomiting, adequate PO intake and patient able to bend at knees (patient up walking) Anesthetic complications: no        Last Vitals:  Vitals:   06/16/16 2315 06/17/16 0315  BP: 128/79 108/69  Pulse: (!) 104 80  Resp: 18 18  Temp: 36.9 C 36.8 C    Last Pain:  Vitals:   06/17/16 0625  TempSrc:   PainSc: 5    Pain Goal: Patients Stated Pain Goal: 4 (06/15/16 2039)               Madison HickmanGREGORY,Hardie Veltre

## 2016-06-18 ENCOUNTER — Inpatient Hospital Stay (HOSPITAL_COMMUNITY): Admission: RE | Admit: 2016-06-18 | Payer: Medicaid Other | Source: Ambulatory Visit

## 2016-07-03 ENCOUNTER — Encounter: Payer: Self-pay | Admitting: Family Medicine

## 2016-07-28 ENCOUNTER — Ambulatory Visit (INDEPENDENT_AMBULATORY_CARE_PROVIDER_SITE_OTHER): Payer: Medicaid Other | Admitting: Family Medicine

## 2016-07-28 ENCOUNTER — Encounter: Payer: Self-pay | Admitting: Family Medicine

## 2016-07-28 VITALS — BP 111/73 | HR 72 | Wt 234.0 lb

## 2016-07-28 DIAGNOSIS — Z3202 Encounter for pregnancy test, result negative: Secondary | ICD-10-CM | POA: Diagnosis not present

## 2016-07-28 DIAGNOSIS — Z3049 Encounter for surveillance of other contraceptives: Secondary | ICD-10-CM | POA: Diagnosis not present

## 2016-07-28 DIAGNOSIS — Z30017 Encounter for initial prescription of implantable subdermal contraceptive: Secondary | ICD-10-CM

## 2016-07-28 LAB — POCT PREGNANCY, URINE: Preg Test, Ur: NEGATIVE

## 2016-07-28 MED ORDER — ETONOGESTREL 68 MG ~~LOC~~ IMPL
68.0000 mg | DRUG_IMPLANT | Freq: Once | SUBCUTANEOUS | Status: AC
  Administered 2016-07-28: 68 mg via SUBCUTANEOUS

## 2016-07-28 NOTE — Patient Instructions (Signed)
Nexplanon Instructions After Insertion   Keep bandage clean and dry for 24 hours   May use ice/Tylenol/Ibuprofen for soreness or pain   If you develop fever, drainage or increased warmth from incision site-contact office immediately  Etonogestrel implant What is this medicine? ETONOGESTREL (et oh noe JES trel) is a contraceptive (birth control) device. It is used to prevent pregnancy. It can be used for up to 3 years. This medicine may be used for other purposes; ask your health care provider or pharmacist if you have questions. COMMON BRAND NAME(S): Implanon, Nexplanon What should I tell my health care provider before I take this medicine? They need to know if you have any of these conditions: -abnormal vaginal bleeding -blood vessel disease or blood clots -cancer of the breast, cervix, or liver -depression -diabetes -gallbladder disease -headaches -heart disease or recent heart attack -high blood pressure -high cholesterol -kidney disease -liver disease -renal disease -seizures -tobacco smoker -an unusual or allergic reaction to etonogestrel, other hormones, anesthetics or antiseptics, medicines, foods, dyes, or preservatives -pregnant or trying to get pregnant -breast-feeding How should I use this medicine? This device is inserted just under the skin on the inner side of your upper arm by a health care professional. Talk to your pediatrician regarding the use of this medicine in children. Special care may be needed. Overdosage: If you think you have taken too much of this medicine contact a poison control center or emergency room at once. NOTE: This medicine is only for you. Do not share this medicine with others. What if I miss a dose? This does not apply. What may interact with this medicine? Do not take this medicine with any of the following medications: -amprenavir -bosentan -fosamprenavir This medicine may also interact with the following  medications: -barbiturate medicines for inducing sleep or treating seizures -certain medicines for fungal infections like ketoconazole and itraconazole -grapefruit juice -griseofulvin -medicines to treat seizures like carbamazepine, felbamate, oxcarbazepine, phenytoin, topiramate -modafinil -phenylbutazone -rifampin -rufinamide -some medicines to treat HIV infection like atazanavir, indinavir, lopinavir, nelfinavir, tipranavir, ritonavir -St. John's wort This list may not describe all possible interactions. Give your health care provider a list of all the medicines, herbs, non-prescription drugs, or dietary supplements you use. Also tell them if you smoke, drink alcohol, or use illegal drugs. Some items may interact with your medicine. What should I watch for while using this medicine? This product does not protect you against HIV infection (AIDS) or other sexually transmitted diseases. You should be able to feel the implant by pressing your fingertips over the skin where it was inserted. Contact your doctor if you cannot feel the implant, and use a non-hormonal birth control method (such as condoms) until your doctor confirms that the implant is in place. If you feel that the implant may have broken or become bent while in your arm, contact your healthcare provider. What side effects may I notice from receiving this medicine? Side effects that you should report to your doctor or health care professional as soon as possible: -allergic reactions like skin rash, itching or hives, swelling of the face, lips, or tongue -breast lumps -changes in emotions or moods -depressed mood -heavy or prolonged menstrual bleeding -pain, irritation, swelling, or bruising at the insertion site -scar at site of insertion -signs of infection at the insertion site such as fever, and skin redness, pain or discharge -signs of pregnancy -signs and symptoms of a blood clot such as breathing problems; changes in  vision; chest pain; severe,   sudden headache; pain, swelling, warmth in the leg; trouble speaking; sudden numbness or weakness of the face, arm or leg -signs and symptoms of liver injury like dark yellow or brown urine; general ill feeling or flu-like symptoms; light-colored stools; loss of appetite; nausea; right upper belly pain; unusually weak or tired; yellowing of the eyes or skin -unusual vaginal bleeding, discharge -signs and symptoms of a stroke like changes in vision; confusion; trouble speaking or understanding; severe headaches; sudden numbness or weakness of the face, arm or leg; trouble walking; dizziness; loss of balance or coordination Side effects that usually do not require medical attention (report to your doctor or health care professional if they continue or are bothersome): -acne -back pain -breast pain -changes in weight -dizziness -general ill feeling or flu-like symptoms -headache -irregular menstrual bleeding -nausea -sore throat -vaginal irritation or inflammation This list may not describe all possible side effects. Call your doctor for medical advice about side effects. You may report side effects to FDA at 1-800-FDA-1088. Where should I keep my medicine? This drug is given in a hospital or clinic and will not be stored at home. NOTE: This sheet is a summary. It may not cover all possible information. If you have questions about this medicine, talk to your doctor, pharmacist, or health care provider.  2018 Elsevier/Gold Standard (2015-08-02 11:19:22)  

## 2016-07-28 NOTE — Progress Notes (Signed)
Subjective:     Christina Mann is a 21 y.o. female who presents for a postpartum visit. She is 6 weeks postpartum following a 06/16/2016 I have fully reviewed the prenatal and intrapartum course. The delivery was at 40 gestational weeks. Outcome: vaginal. Anesthesia: epidual. Postpartum course has been uncomplicated. Baby's course has been uncomplicated, gave baby up for adoption, it is an open adoption. She feels fine about the adoption, no sadness, crying, or regret. Unknown feeding of baby (as it is adopted, but likely formula). Bleeding small amount. Bowel function is normal. Bladder function is normal. Patient is  sexually active using condoms. Contraception method is Nexplanon today. Postpartum depression screening: negative.  The following portions of the patient's history were reviewed and updated as appropriate: allergies, current medications, past family history, past medical history, past social history, past surgical history and problem list.  Patient was very short with the visit, wanted to leave right away because she had "somewhere" to be. Denied anything was wrong or feeling sad.    Review of Systems Pertinent items noted in HPI and remainder of comprehensive ROS otherwise negative.   Objective:    BP 111/73   Pulse 72   Wt 234 lb (106.1 kg)   BMI 36.65 kg/m   General:  alert, cooperative and appears stated age   Breasts:  Patient refused  Lungs: clear to auscultation bilaterally  Heart:  regular rate and rhythm, S1, S2 normal, no murmur, click, rub or gallop  Abdomen: soft, non-tender; bowel sounds normal; no masses,  no organomegaly  Extremities: No edema. No rashes or erythema.         GYNECOLOGY CLINIC PROCEDURE NOTE  Christina Mann is a 21 y.o. G1P1001 here for Nexplanon insertion.  This is patient's postpartum visit, 6 weeks.  No other gynecologic concerns.  Nexplanon Insertion Procedure Patient identified, informed consent performed, consent signed.    Patient does understand that irregular bleeding is a very common side effect of this medication. She was advised to have backup contraception for one week after placement. Pregnancy test in clinic today was negative.  Appropriate time out taken.  Patient's left arm was prepped and draped in the usual sterile fashion.. The ruler used to measure and mark insertion area.  Patient was prepped with alcohol swab and then injected with 3 ml of 1% lidocaine.  She was prepped with betadine, Nexplanon removed from packaging,  Device confirmed in needle, then inserted full length of needle and withdrawn per handbook instructions. Nexplanon was able to palpated in the patient's arm; patient palpated the insert herself. There was minimal blood loss.  Patient insertion site covered with guaze and a pressure bandage to reduce any bruising.  The patient tolerated the procedure well and was given post procedure instructions.    Expiration date: 11/2018  Lot #: [id number]   Assessment:     6 week postpartum exam. Pap smear N/A, <21 y/o.  Plan:    1. Contraception: Nexplanon 2. Follow up in: 7 months for pap or as needed.

## 2016-08-14 ENCOUNTER — Ambulatory Visit: Payer: Self-pay

## 2017-05-30 ENCOUNTER — Other Ambulatory Visit: Payer: Self-pay

## 2017-05-30 ENCOUNTER — Encounter (HOSPITAL_COMMUNITY): Payer: Self-pay

## 2017-05-30 ENCOUNTER — Emergency Department (HOSPITAL_COMMUNITY)
Admission: EM | Admit: 2017-05-30 | Discharge: 2017-05-30 | Disposition: A | Discharge status: Home / Self Care | Payer: Self-pay | Attending: Emergency Medicine | Admitting: Emergency Medicine

## 2017-05-30 DIAGNOSIS — M545 Low back pain, unspecified: Secondary | ICD-10-CM

## 2017-05-30 DIAGNOSIS — Y999 Unspecified external cause status: Secondary | ICD-10-CM | POA: Insufficient documentation

## 2017-05-30 DIAGNOSIS — Y939 Activity, unspecified: Secondary | ICD-10-CM | POA: Insufficient documentation

## 2017-05-30 DIAGNOSIS — M546 Pain in thoracic spine: Secondary | ICD-10-CM | POA: Insufficient documentation

## 2017-05-30 DIAGNOSIS — Y92410 Unspecified street and highway as the place of occurrence of the external cause: Secondary | ICD-10-CM | POA: Insufficient documentation

## 2017-05-30 MED ORDER — IBUPROFEN 600 MG PO TABS: 600.0000 mg | ORAL_TABLET | Freq: Four times a day (QID) | ORAL | 0 refills | Status: DC

## 2017-05-30 MED ORDER — IBUPROFEN 800 MG PO TABS
800.0000 mg | ORAL_TABLET | Freq: Once | ORAL | Status: AC
  Administered 2017-05-30: 800 mg via ORAL
  Filled 2017-05-30: qty 1

## 2017-05-30 MED ORDER — METHOCARBAMOL 500 MG PO TABS: 500.0000 mg | ORAL_TABLET | Freq: Two times a day (BID) | ORAL | 0 refills | Status: DC

## 2017-05-30 MED ORDER — METHOCARBAMOL 500 MG PO TABS
500.0000 mg | ORAL_TABLET | Freq: Once | ORAL | Status: AC
  Administered 2017-05-30: 500 mg via ORAL
  Filled 2017-05-30: qty 1
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
## 2017-05-30 NOTE — Discharge Instructions (Addendum)
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
## 2017-07-02 ENCOUNTER — Encounter (HOSPITAL_COMMUNITY): Payer: Self-pay | Admitting: Emergency Medicine

## 2017-07-02 ENCOUNTER — Ambulatory Visit (INDEPENDENT_AMBULATORY_CARE_PROVIDER_SITE_OTHER): Payer: Self-pay

## 2017-07-02 ENCOUNTER — Telehealth (HOSPITAL_COMMUNITY): Payer: Self-pay | Admitting: Emergency Medicine

## 2017-07-02 ENCOUNTER — Ambulatory Visit (HOSPITAL_COMMUNITY)
Admission: EM | Admit: 2017-07-02 | Discharge: 2017-07-02 | Disposition: A | Discharge status: Home / Self Care | Payer: Self-pay | Attending: Family Medicine | Admitting: Family Medicine

## 2017-07-02 DIAGNOSIS — S39012D Strain of muscle, fascia and tendon of lower back, subsequent encounter: Secondary | ICD-10-CM

## 2017-07-02 DIAGNOSIS — S39012A Strain of muscle, fascia and tendon of lower back, initial encounter: Secondary | ICD-10-CM

## 2017-07-02 MED ORDER — IBUPROFEN 800 MG PO TABS: 800.0000 mg | ORAL_TABLET | Freq: Three times a day (TID) | ORAL | 0 refills | Status: DC

## 2017-07-02 MED ORDER — CYCLOBENZAPRINE HCL 10 MG PO TABS: 5.0000 mg | ORAL_TABLET | Freq: Two times a day (BID) | ORAL | 0 refills | Status: DC | PRN
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
## 2017-07-02 NOTE — Discharge Instructions (Addendum)
Take the ibuprofen 3 x a day with food Take the cyclobenzaprine 5 - 10 mg at night May take during day if not working or driving Stretch daily Follow up with back doctor if not improving in a couple of weeks
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
## 2018-01-06 IMAGING — US US OB LIMITED
1 series · 14 of 15 positions shown · non-contrast
Comparison: none

CLINICAL DATA: Patient is not fell baby move for 1 week. Estimated
gestational age by LMP is 26 weeks 2 days.

EXAM:
LIMITED OBSTETRIC ULTRASOUND

[Series 1: us ob limited · 0.23mm/px · 14 of 15 slices shown]
[im 1/15]
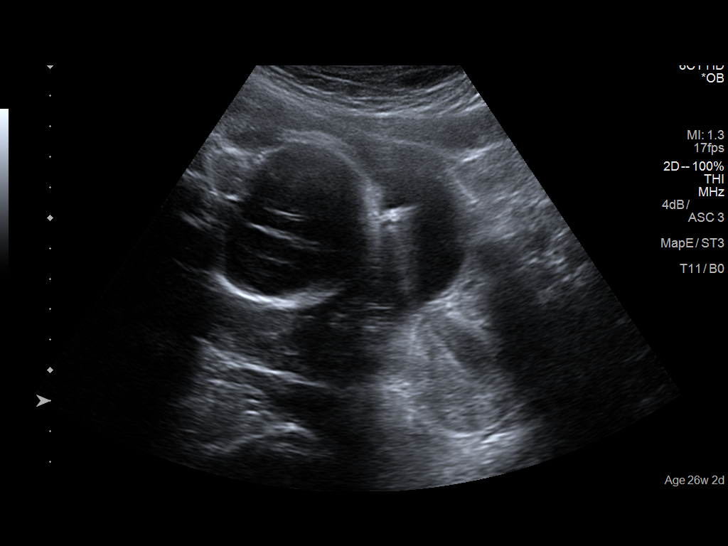
[im 2/15]
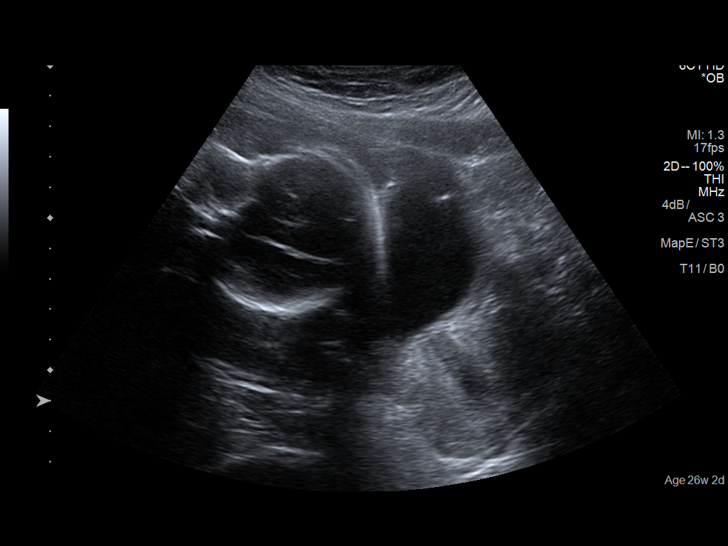
[im 3/15]
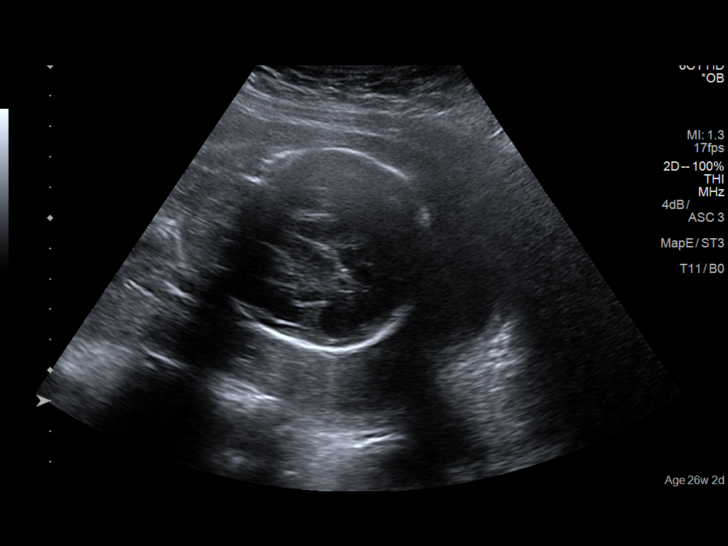
[im 4/15]
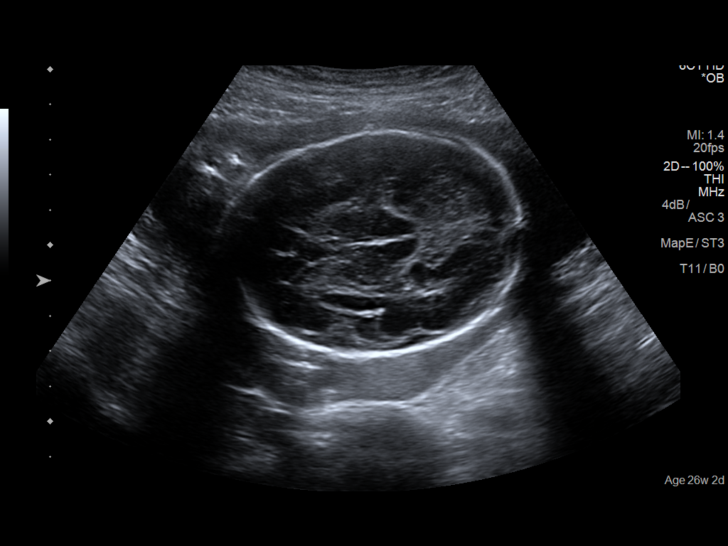
[im 5/15]
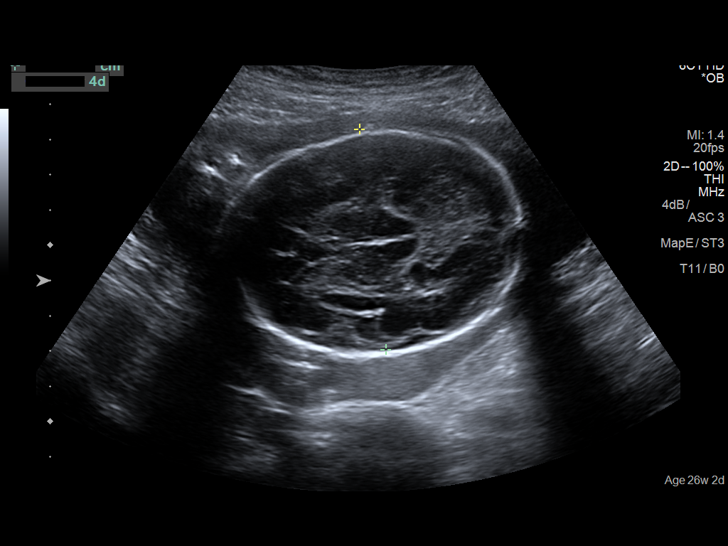
[im 6/15]
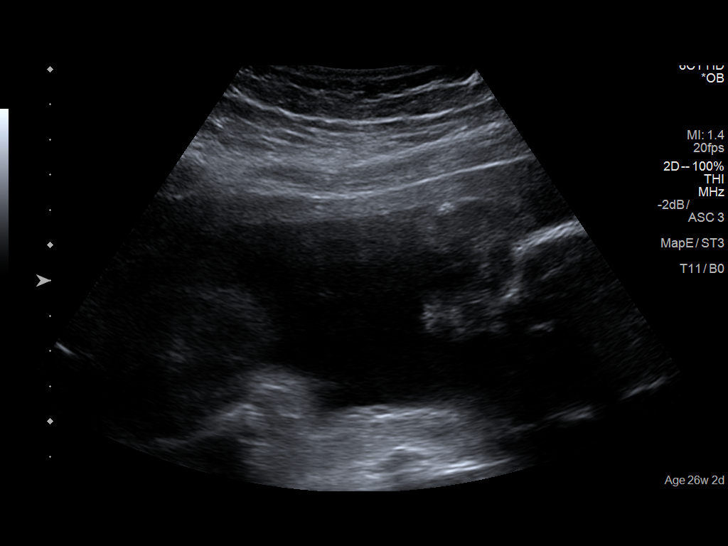
[im 7/15]
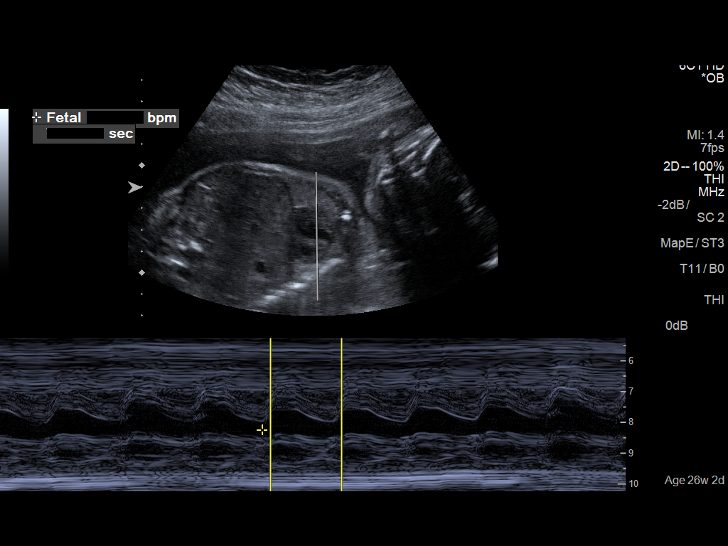
[im 9/15]
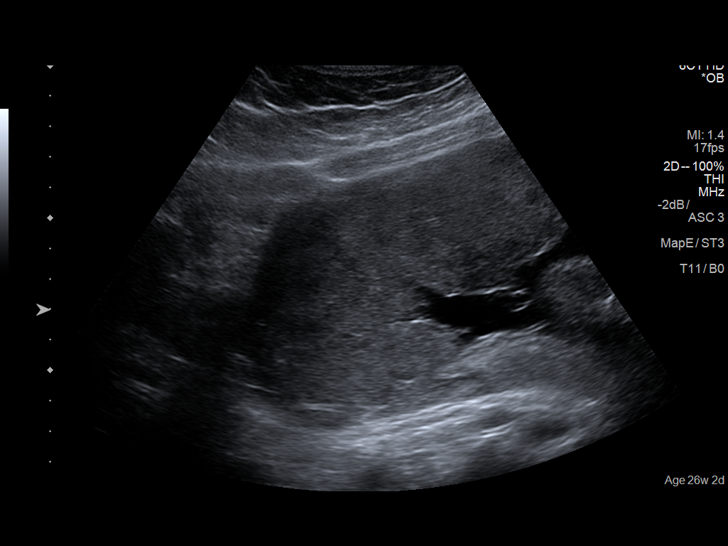
[im 10/15]
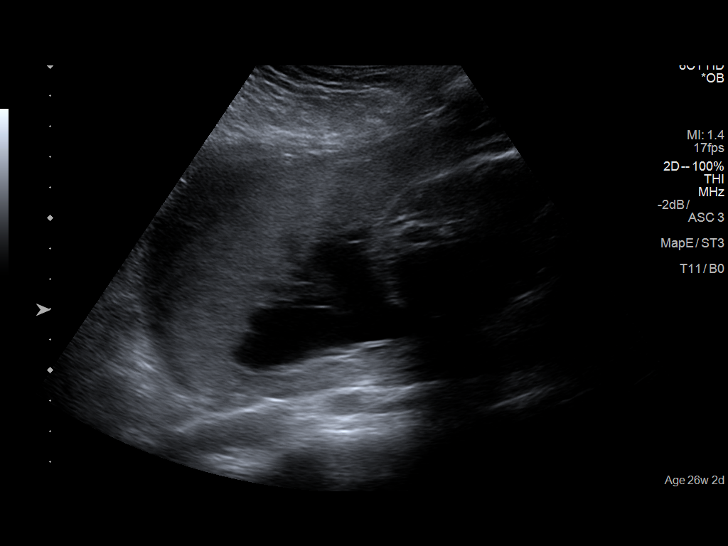
[im 11/15]
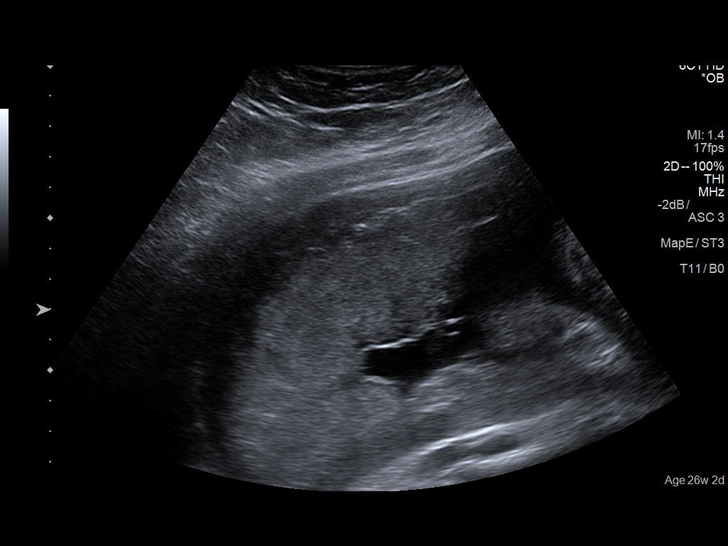
[im 12/15]
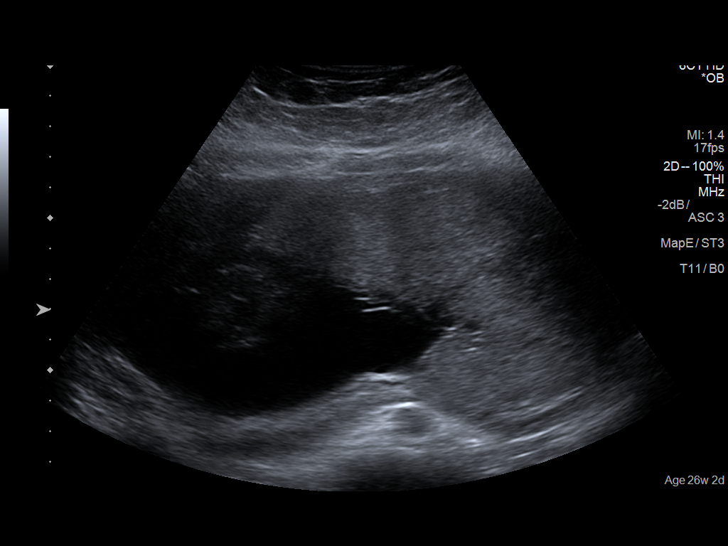
[im 13/15]
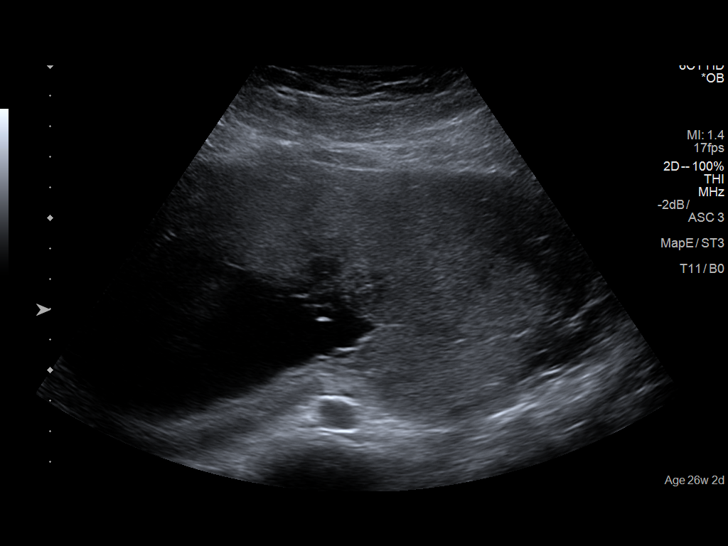
[im 14/15]
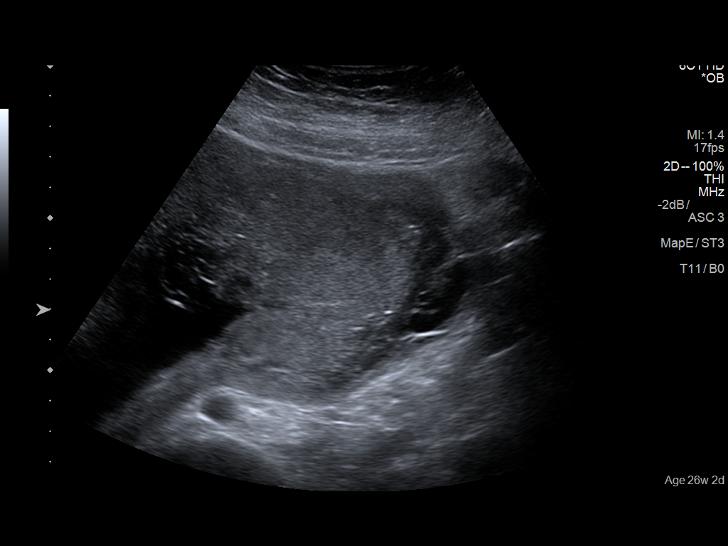
[im 15/15]
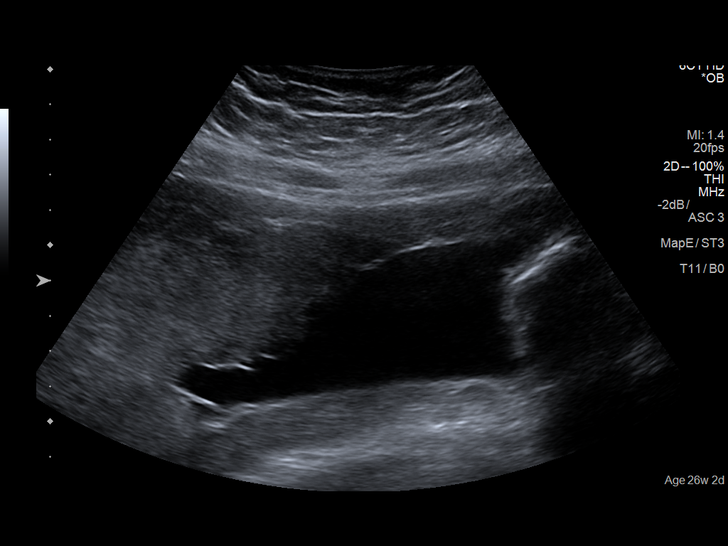

[14 of 15 positions shown; findings below may reference images not displayed]

FINDINGS: Number of Fetuses: 1

Heart Rate:  144 bpm

Movement: Fetal movement is observed during real-time imaging.

Presentation: Cephalic presentation.

Placental Location: Placenta is anterior and fundal towards the
left.

Previa: No previa

Amniotic Fluid (Subjective):  Within normal limits.

BPD:  6.3cm 25w  4d

MATERNAL FINDINGS:

Cervix:  Appears closed.

Uterus/Adnexae: Limited visualization of the uterus. Ovaries were
not visualized.
IMPRESSION: Single intrauterine pregnancy is demonstrated in cephalic
presentation. Fetal movement is observed during real-time imaging.
No acute complication is identified on limited ultrasound images.

This exam is performed on an emergent basis and does not
comprehensively evaluate fetal size, dating, or anatomy; follow-up
complete OB US should be considered if further fetal assessment is
warranted.

## 2018-04-18 ENCOUNTER — Emergency Department (HOSPITAL_COMMUNITY)
Admission: EM | Admit: 2018-04-18 | Discharge: 2018-04-19 | Disposition: A | Discharge status: Home / Self Care | Payer: Medicaid Other | Attending: Emergency Medicine | Admitting: Emergency Medicine

## 2018-04-18 ENCOUNTER — Encounter (HOSPITAL_COMMUNITY): Payer: Self-pay | Admitting: *Deleted

## 2018-04-18 ENCOUNTER — Other Ambulatory Visit: Payer: Self-pay

## 2018-04-18 ENCOUNTER — Telehealth: Payer: Medicaid Other | Admitting: Nurse Practitioner

## 2018-04-18 DIAGNOSIS — R0602 Shortness of breath: Secondary | ICD-10-CM

## 2018-04-18 DIAGNOSIS — R0789 Other chest pain: Secondary | ICD-10-CM | POA: Insufficient documentation

## 2018-04-18 DIAGNOSIS — R059 Cough, unspecified: Secondary | ICD-10-CM

## 2018-04-18 DIAGNOSIS — R05 Cough: Secondary | ICD-10-CM | POA: Insufficient documentation

## 2018-04-18 LAB — CBC WITH DIFFERENTIAL/PLATELET
ABS IMMATURE GRANULOCYTES: 0.03 10*3/uL (ref 0.00–0.07)
BASOS ABS: 0 10*3/uL (ref 0.0–0.1)
Basophils Relative: 0 %
Eosinophils Absolute: 0.1 10*3/uL (ref 0.0–0.5)
Eosinophils Relative: 1 %
HCT: 38.1 % (ref 36.0–46.0)
HEMOGLOBIN: 11.8 g/dL — AB (ref 12.0–15.0)
IMMATURE GRANULOCYTES: 0 %
LYMPHS PCT: 26 %
Lymphs Abs: 2.5 10*3/uL (ref 0.7–4.0)
MCH: 27.8 pg (ref 26.0–34.0)
MCHC: 31 g/dL (ref 30.0–36.0)
MCV: 89.9 fL (ref 80.0–100.0)
Monocytes Absolute: 0.6 10*3/uL (ref 0.1–1.0)
Monocytes Relative: 6 %
NEUTROS PCT: 67 %
NRBC: 0 % (ref 0.0–0.2)
Neutro Abs: 6.3 10*3/uL (ref 1.7–7.7)
PLATELETS: 439 10*3/uL — AB (ref 150–400)
RBC: 4.24 MIL/uL (ref 3.87–5.11)
RDW: 14.1 % (ref 11.5–15.5)
WBC: 9.6 10*3/uL (ref 4.0–10.5)

## 2018-04-18 LAB — COMPREHENSIVE METABOLIC PANEL
ALBUMIN: 3.5 g/dL (ref 3.5–5.0)
ALK PHOS: 71 U/L (ref 38–126)
ALT: 23 U/L (ref 0–44)
AST: 24 U/L (ref 15–41)
Anion gap: 6 (ref 5–15)
BUN: 11 mg/dL (ref 6–20)
CALCIUM: 9 mg/dL (ref 8.9–10.3)
CO2: 23 mmol/L (ref 22–32)
CREATININE: 0.86 mg/dL (ref 0.44–1.00)
Chloride: 110 mmol/L (ref 98–111)
GFR calc Af Amer: >60 mL/min (ref >60)
GFR calc non Af Amer: >60 mL/min (ref >60)
GLUCOSE: 120 mg/dL — AB (ref 70–99)
Potassium: 4.1 mmol/L (ref 3.5–5.1)
SODIUM: 139 mmol/L (ref 135–145)
Total Bilirubin: <0.1 mg/dL — ABNORMAL LOW (ref 0.3–1.2)
Total Protein: 6.6 g/dL (ref 6.5–8.1)

## 2018-04-18 LAB — D-DIMER, QUANTITATIVE: D-Dimer, Quant: 0.44 ug/mL-FEU (ref 0.00–0.50)

## 2018-04-18 LAB — I-STAT BETA HCG BLOOD, ED (MC, WL, AP ONLY): I-stat hCG, quantitative: <5 m[IU]/mL (ref <5)

## 2018-04-18 NOTE — Progress Notes (Signed)
3.0 E-Visit for Corona Virus Screening  Based on what you have shared with me, you need to seek an evaluation for a severe illness that is causing your symptoms which may be coronavirus or some other illness. I recommend that you be seen and evaluated "face to face". Our Emergency Departments are best equipped to handle patients with severe symptoms.   I recommend the following:  . If you are having a true medical emergency please call 911. . If you are considered high risk for Corona virus because of a known exposure, fever, shortness of breath and cough, OR if you have severe symptoms of any kind, seek medical care at an emergency room.  . Please call ahead and tell them that you were seen by telemedicine and they have recommended that you have a face to face evaluation. . [facility] Emergency Department [address], [place], [place] [address] [phone]  . [facility] [place] Emergency Department [address], [place], [place] [address] [phone]  . [facility] Emergency Department [address], [place], [place] [address] [phone]  . [facility] Emergency Department [address], [place], [place] [address] [phone]  . [facility] Emergency Department [address], [place], [place] [address] [phone]  NOTE: If you entered your credit card information for this eVisit, you will not be charged. You may see a "hold" on your card for the $35 but that hold will drop off and you will not have a charge processed.   Your e-visit answers were reviewed by a board certified advanced clinical practitioner to complete your personal care plan.  Thank you for using e-Visits.   5 minutes spent reviewing and documenting in chart. 

## 2018-04-18 NOTE — ED Provider Notes (Signed)
[name] Provider Note   CSN: [id number] Arrival date & time: 04/18/18  2242    History   Chief Complaint Chief Complaint  Patient presents with  . Shortness of Breath    HPI Christina Mann is a 23 y.o. female with a hx of no major medical problems presents to the Emergency Department complaining of gradual, persistent, progressively worsening shortness of breath onset 04/08/2018.   Pt reports she has had a cough for 2 days and pain with inhalation.  Pt denies recent travel, known sick contacts.  Pt reports birth control usage, but denies periods of immobilization, recent travel, history of DVT, smoking, lupus.  Patient denies dyspnea on exertion.  She denies recent travel or sick contacts.  She denies fever, chills, headache, neck pain, abdominal pain, nausea, vomiting, diarrhea, weakness, dizziness, syncope.  Inspiration creates chest pain but no other aggravating factors.  No alleviating factors.  Patient reports buying an over-the-counter inhaler which has not helped her symptoms.  She did attempt an ED visit earlier but this directed her to the emergency department.      The history is provided by the patient and medical records. No language interpreter was used.    Past Medical History:  Diagnosis Date  . Medical history non-contributory     Patient Active Problem List   Diagnosis Date Noted  . Fetal heart rate/rhythm abnormality affecting management of mother 06/15/2016  . Pregnancy with adoption planned, currently in third trimester 04/28/2016  . Supervision of normal first pregnancy, antepartum 03/13/2016  . Insufficient prenatal care, third trimester 03/13/2016    Past Surgical History:  Procedure Laterality Date  . NO PAST SURGERIES       OB History    Gravida  1   Para  1   Term  1   Preterm      AB      Living  1     SAB      TAB      Ectopic      Multiple  0   Live Births  1             Home Medications    Prior to Admission medications   Not on File    Family History No family history on file.  Social History Social History   Tobacco Use  . Smoking status: Never Smoker  . Smokeless tobacco: Never Used  Substance Use Topics  . Alcohol use: No  . Drug use: No     Allergies   Patient has no known allergies.   Review of Systems Review of Systems  Constitutional: Negative for appetite change, diaphoresis, fatigue, fever and unexpected weight change.  HENT: Negative for mouth sores.   Eyes: Negative for visual disturbance.  Respiratory: Positive for cough and shortness of breath. Negative for chest tightness and wheezing.   Cardiovascular: Positive for chest pain.  Gastrointestinal: Negative for abdominal pain, constipation, diarrhea, nausea and vomiting.  Endocrine: Negative for polydipsia, polyphagia and polyuria.  Genitourinary: Negative for dysuria, frequency, hematuria and urgency.  Musculoskeletal: Negative for back pain and neck stiffness.  Skin: Negative for rash.  Allergic/Immunologic: Negative for immunocompromised state.  Neurological: Negative for syncope, light-headedness and headaches.  Hematological: Does not bruise/bleed easily.  Psychiatric/Behavioral: Negative for sleep disturbance. The patient is not nervous/anxious.      Physical Exam Updated Vital Signs BP (!) 141/98 (BP Location: Right Arm)   Pulse 95   Temp 98.9 F (  37.2 C) (Oral)   Resp 17   Ht 5\' 7"  (1.702 m)   Wt 122.9 kg   LMP 04/08/2018   SpO2 100%   BMI 42.44 kg/m   Physical Exam Vitals signs and nursing note reviewed.  Constitutional:      General: She is not in acute distress.    Appearance: She is well-developed. She is not diaphoretic.     Comments: Awake, alert, nontoxic appearance  HENT:     Head: Normocephalic and atraumatic.     Mouth/Throat:     Pharynx: No oropharyngeal exudate.  Eyes:     General: No scleral icterus.    Conjunctiva/sclera:  Conjunctivae normal.  Neck:     Musculoskeletal: Normal range of motion and neck supple.  Cardiovascular:     Rate and Rhythm: Regular rhythm. Tachycardia present.     Comments: Very mild tachycardia Pulmonary:     Effort: Pulmonary effort is normal. No respiratory distress.     Breath sounds: Normal breath sounds. No wheezing.  Abdominal:     General: Bowel sounds are normal.     Palpations: Abdomen is soft. There is no mass.     Tenderness: There is no abdominal tenderness. There is no guarding or rebound.  Musculoskeletal: Normal range of motion.     Comments: No peripheral edema or calf tenderness.  Skin:    General: Skin is warm and dry.  Neurological:     Mental Status: She is alert.     Comments: Speech is clear and goal oriented Moves extremities without ataxia      ED Treatments / Results  Labs (all labs ordered are listed, but only abnormal results are displayed) Labs Reviewed  CBC WITH DIFFERENTIAL/PLATELET - Abnormal; Notable for the following components:      Result Value   Hemoglobin 11.8 (*)    Platelets 439 (*)    All other components within normal limits  COMPREHENSIVE METABOLIC PANEL - Abnormal; Notable for the following components:   Glucose, Bld 120 (*)    Total Bilirubin <0.1 (*)    All other components within normal limits  D-DIMER, QUANTITATIVE (NOT AT [facility])  I-STAT BETA HCG BLOOD, ED ([facility], [facility], [facility] ONLY)    EKG EKG Interpretation  Date/Time:  Sunday April 18 2018 23:02:28 EDT Ventricular Rate:  89 PR Interval:    QRS Duration: 96 QT Interval:  352 QTC Calculation: 429 R Axis:   49 Text Interpretation:  Sinus rhythm Borderline Q waves in lateral leads Borderline T wave abnormalities Baseline wander in lead(s) V6 Confirmed by Geoffery Lyons ([id number]) on 04/18/2018 11:22:58 PM   Radiology Dg Chest Port 1 View  Result Date: 04/19/2018 CLINICAL DATA:  Initial evaluation for acute cough, shortness of breath. EXAM: PORTABLE CHEST 1 VIEW COMPARISON:   None available. FINDINGS: Patient is rotated to the right. Allowing for rotation, transverse heart size within normal limits. Mediastinal silhouette normal. Lungs are hypoinflated with elevation left hemidiaphragm. No consolidative opacity. No pulmonary edema or pleural effusion. No pneumothorax. No acute osseous finding. IMPRESSION: 1. Shallow lung inflation with elevation of the left hemidiaphragm. 2. No other active cardiopulmonary disease. Electronically Signed   By: Rise Mu M.D.   On: 04/19/2018 01:02    Procedures Procedures (including critical care time)  Medications Ordered in ED Medications  albuterol (PROVENTIL HFA;VENTOLIN HFA) 108 (90 Base) MCG/ACT inhaler 2 puff (has no administration in time range)  AeroChamber Plus Flo-Vu Large MISC 1 each (has no administration in time range)  Initial Impression / Assessment and Plan / ED Course  I have reviewed the triage vital signs and the nursing notes.  Pertinent labs & imaging results that were available during my care of the patient were reviewed by me and considered in my medical decision making (see chart for details).  Clinical Course as of Apr 18 108  Sun Apr 18, 2018  2345 Improved from baseline  Hemoglobin(!): 11.8 [HM]  2345 Mild tachycardia on arrival  Pulse Rate(!): 101 [HM]  2346 afebrile  Temp: 98.9 F (37.2 C) [HM]  2346 Mild HTN  BP(!): 141/98 [HM]  2346 No hypoxia or increased work of breathing  SpO2: 100 % [HM]  Mon Apr 19, 2018  0108 No persistent tachycardia  Pulse Rate: 86 [HM]    Clinical Course User Index [HM] Alyssamarie Mounsey, Boyd Kerbs       Presents for shortness of breath cough and chest pain for the last several days.  Patient mildly tachycardic on initial exam however while at rest in bed her tachycardia improved.  Mild anemia 11.8 however this appears to be better than previous baseline.  She is afebrile without hypoxia or increased work of breathing.  Labs are otherwise  reassuring.  EKG is without evidence of acute ischemia.  D-dimer is negative and patient is low risk for DVT.  Chest x-ray with dense of consolidating opacity, pulmonary edema or pneumothorax.  I personally evaluated these images..  She does not have influenza-like illness.  Less likely to be flu or COVID-19.  Will give albuterol MDI as needed.  She will be discharged home with quarantine and monitoring.  She is to return immediately to the emergency department for new or worsening symptoms.  Patient states understanding and is in agreement with the plan.  Final Clinical Impressions(s) / ED Diagnoses   Final diagnoses:  Shortness of breath    ED Discharge Orders    None       Mardene Sayer Boyd Kerbs 04/19/18 Geralyn Flash, MD 04/19/18 [phone]

## 2018-04-18 NOTE — ED Notes (Signed)
Pain is worse with inspiration  No leg pain

## 2018-04-18 NOTE — ED Triage Notes (Signed)
The pt is c/o chest pain and difficulty breathing since the 12th of march  lmp  A few days ago     No known injury  She has not seen a doctor since the beginning of her symptoms

## 2018-04-19 ENCOUNTER — Emergency Department (HOSPITAL_COMMUNITY): Payer: Medicaid Other

## 2018-04-19 MED ORDER — AEROCHAMBER PLUS FLO-VU LARGE MISC
1.0000 | Freq: Once | Status: AC
  Administered 2018-04-19: 1

## 2018-04-19 MED ORDER — ALBUTEROL SULFATE HFA 108 (90 BASE) MCG/ACT IN AERS
2.0000 | INHALATION_SPRAY | RESPIRATORY_TRACT | Status: DC | PRN
  Administered 2018-04-19: 2 via RESPIRATORY_TRACT
  Filled 2018-04-19: qty 6.7

## 2018-04-19 NOTE — Discharge Instructions (Addendum)
1. Medications: albuterol, usual home medications 2. Treatment: rest, drink plenty of fluids, begin OTC antihistamine (Zyrtec or Claritin)  3. Follow Up: Please followup with your primary doctor in 2-3 days for discussion of your diagnoses and further evaluation after today's visit; if you do not have a primary care doctor use the resource guide provided to find one; Please return to the ER for difficulty breathing, high fevers or worsening symptoms.      Person Under Monitoring Name: Christina Mann  Location: [address] [place] [address]   Infection Prevention Recommendations for Individuals Confirmed to have, or Being Evaluated for, 2019 Novel Coronavirus (COVID-19) Infection Who Receive Care at Home  Individuals who are confirmed to have, or are being evaluated for, COVID-19 should follow the prevention steps below until a healthcare provider or local or state health department says they can return to normal activities.  Stay home except to get medical care You should restrict activities outside your home, except for getting medical care. Do not go to work, school, or public areas, and do not use public transportation or taxis.  Call ahead before visiting your doctor Before your medical appointment, call the healthcare provider and tell them that you have, or are being evaluated for, COVID-19 infection. This will help the healthcare providers office take steps to keep other people from getting infected. Ask your healthcare provider to call the local or state health department.  Monitor your symptoms Seek prompt medical attention if your illness is worsening (e.g., difficulty breathing). Before going to your medical appointment, call the healthcare provider and tell them that you have, or are being evaluated for, COVID-19 infection. Ask your healthcare provider to call the local or state health department.  Wear a facemask You should wear a facemask that covers  your nose and mouth when you are in the same room with other people and when you visit a healthcare provider. People who live with or visit you should also wear a facemask while they are in the same room with you.  Separate yourself from other people in your home As much as possible, you should stay in a different room from other people in your home. Also, you should use a separate bathroom, if available.  Avoid sharing household items You should not share dishes, drinking glasses, cups, eating utensils, towels, bedding, or other items with other people in your home. After using these items, you should wash them thoroughly with soap and water.  Cover your coughs and sneezes Cover your mouth and nose with a tissue when you cough or sneeze, or you can cough or sneeze into your sleeve. Throw used tissues in a lined trash can, and immediately wash your hands with soap and water for at least 20 seconds or use an alcohol-based hand rub.  Wash your [employer] your hands often and thoroughly with soap and water for at least 20 seconds. You can use an alcohol-based hand sanitizer if soap and water are not available and if your hands are not visibly dirty. Avoid touching your eyes, nose, and mouth with unwashed hands.   Prevention Steps for Caregivers and Household Members of Individuals Confirmed to have, or Being Evaluated for, COVID-19 Infection Being Cared for in the Home  If you live with, or provide care at home for, a person confirmed to have, or being evaluated for, COVID-19 infection please follow these guidelines to prevent infection:  Follow healthcare providers instructions Make sure that you understand and  can help the patient follow any healthcare provider instructions for all care.  Provide for the patients basic needs You should help the patient with basic needs in the home and provide support for getting groceries, prescriptions, and other personal needs.  Monitor the  patients symptoms If they are getting sicker, call his or her medical provider and tell them that the patient has, or is being evaluated for, COVID-19 infection. This will help the healthcare providers office take steps to keep other people from getting infected. Ask the healthcare provider to call the local or state health department.  Limit the number of people who have contact with the patient If possible, have only one caregiver for the patient. Other household members should stay in another home or place of residence. If this is not possible, they should stay in another room, or be separated from the patient as much as possible. Use a separate bathroom, if available. Restrict visitors who do not have an essential need to be in the home.  Keep older adults, very young children, and other sick people away from the patient Keep older adults, very young children, and those who have compromised immune systems or chronic health conditions away from the patient. This includes people with chronic heart, lung, or kidney conditions, diabetes, and cancer.  Ensure good ventilation Make sure that shared spaces in the home have good air flow, such as from an air conditioner or an opened window, weather permitting.  Wash your hands often Wash your hands often and thoroughly with soap and water for at least 20 seconds. You can use an alcohol based hand sanitizer if soap and water are not available and if your hands are not visibly dirty. Avoid touching your eyes, nose, and mouth with unwashed hands. Use disposable paper towels to dry your hands. If not available, use dedicated cloth towels and replace them when they become wet.  Wear a facemask and gloves Wear a disposable facemask at all times in the room and gloves when you touch or have contact with the patients blood, body fluids, and/or secretions or excretions, such as sweat, saliva, sputum, nasal mucus, vomit, urine, or feces.  Ensure the mask  fits over your nose and mouth tightly, and do not touch it during use. Throw out disposable facemasks and gloves after using them. Do not reuse. Wash your hands immediately after removing your facemask and gloves. If your personal clothing becomes contaminated, carefully remove clothing and launder. Wash your hands after handling contaminated clothing. Place all used disposable facemasks, gloves, and other waste in a lined container before disposing them with other household waste. Remove gloves and wash your hands immediately after handling these items.  Do not share dishes, glasses, or other household items with the patient Avoid sharing household items. You should not share dishes, drinking glasses, cups, eating utensils, towels, bedding, or other items with a patient who is confirmed to have, or being evaluated for, COVID-19 infection. After the person uses these items, you should wash them thoroughly with soap and water.  Wash laundry thoroughly Immediately remove and wash clothes or bedding that have blood, body fluids, and/or secretions or excretions, such as sweat, saliva, sputum, nasal mucus, vomit, urine, or feces, on them. Wear gloves when handling laundry from the patient. Read and follow directions on labels of laundry or clothing items and detergent. In general, wash and dry with the warmest temperatures recommended on the label.  Clean all areas the individual has used often Clean all  touchable surfaces, such as counters, tabletops, doorknobs, bathroom fixtures, toilets, phones, keyboards, tablets, and bedside tables, every day. Also, clean any surfaces that may have blood, body fluids, and/or secretions or excretions on them. Wear gloves when cleaning surfaces the patient has come in contact with. Use a diluted bleach solution (e.g., dilute bleach with 1 part bleach and 10 parts water) or a household disinfectant with a label that says EPA-registered for coronaviruses. To make a  bleach solution at home, add 1 tablespoon of bleach to 1 quart (4 cups) of water. For a larger supply, add  cup of bleach to 1 gallon (16 cups) of water. Read labels of cleaning products and follow recommendations provided on product labels. Labels contain instructions for safe and effective use of the cleaning product including precautions you should take when applying the product, such as wearing gloves or eye protection and making sure you have good ventilation during use of the product. Remove gloves and wash hands immediately after cleaning.  Monitor yourself for signs and symptoms of illness Caregivers and household members are considered close contacts, should monitor their health, and will be asked to limit movement outside of the home to the extent possible. Follow the monitoring steps for close contacts listed on the symptom monitoring form.   ? If you have additional questions, contact your local health department or call the epidemiologist on call at [phone] (available 24/7). ? This guidance is subject to change. For the most up-to-date guidance from [facility], please refer to their website: [email or web address]hu

## 2018-04-19 NOTE — ED Notes (Signed)
Patient verbalizes understanding of discharge instructions. Opportunity for questioning and answers were provided. Armband removed by staff, pt discharged from ED.  

## 2018-12-03 ENCOUNTER — Telehealth: Payer: Medicaid Other | Admitting: Nurse Practitioner

## 2018-12-03 DIAGNOSIS — N390 Urinary tract infection, site not specified: Secondary | ICD-10-CM

## 2018-12-03 MED ORDER — CEPHALEXIN 500 MG PO CAPS
500.0000 mg | ORAL_CAPSULE | Freq: Two times a day (BID) | ORAL | 0 refills | Status: AC
Start: 2018-12-03 — End: 2018-12-10

## 2018-12-03 MED ORDER — PHENAZOPYRIDINE HCL 100 MG PO TABS: 100.0000 mg | ORAL_TABLET | Freq: Three times a day (TID) | ORAL | 0 refills | Status: AC | PRN

## 2018-12-03 NOTE — Progress Notes (Signed)
We are sorry that you are not feeling well.  Here is how we plan to help!  Based on what you shared with me it looks like you most likely have a simple urinary tract infection.  A UTI (Urinary Tract Infection) is a bacterial infection of the bladder.  Most cases of urinary tract infections are simple to treat but a key part of your care is to encourage you to drink plenty of fluids and watch your symptoms carefully.  I have prescribed Keflex 500 mg twice a day for 7 days. I am also prescribing Pyridium 100mg  to take up to three times daily for 3 days.  Your symptoms should gradually improve. Call us if the burning in your urine worsens, you develop worsening fever, back pain or pelvic pain or if your symptoms do not resolve after completing the antibiotic.  Urinary tract infections can be prevented by drinking plenty of water to keep your body hydrated.  Also be sure when you wipe, wipe from front to back and don't hold it in!  If possible, empty your bladder every 4 hours.  Your e-visit answers were reviewed by a board certified advanced clinical practitioner to complete your personal care plan.  Depending on the condition, your plan could have included both over the counter or prescription medications.  If there is a problem please reply  once you have received a response from your provider.  Your safety is important to [place].  If you have drug allergies check your prescription carefully.    You can use MyChart to ask questions about today's visit, request a non-urgent call back, or ask for a work or school excuse for 24 hours related to this e-Visit. If it has been greater than 24 hours you will need to follow up with your provider, or enter a new e-Visit to address those concerns.   You will get an e-mail in the next two days asking about your experience.  I hope that your e-visit has been valuable and will speed your recovery. Thank you for using e-visits.  I have spent at least 5 minutes  reviewing and documenting in the patient's chart.

## 2019-09-14 ENCOUNTER — Emergency Department (HOSPITAL_COMMUNITY): Payer: Self-pay

## 2019-09-14 ENCOUNTER — Other Ambulatory Visit: Payer: Self-pay

## 2019-09-14 ENCOUNTER — Emergency Department (HOSPITAL_COMMUNITY)
Admission: EM | Admit: 2019-09-14 | Discharge: 2019-09-14 | Disposition: A | Discharge status: Home / Self Care | Payer: Self-pay | Attending: Emergency Medicine | Admitting: Emergency Medicine

## 2019-09-14 ENCOUNTER — Encounter (HOSPITAL_COMMUNITY): Payer: Self-pay

## 2019-09-14 DIAGNOSIS — Z5321 Procedure and treatment not carried out due to patient leaving prior to being seen by health care provider: Secondary | ICD-10-CM | POA: Insufficient documentation

## 2019-09-14 MED ORDER — ACETAMINOPHEN 325 MG PO TABS
650.0000 mg | ORAL_TABLET | Freq: Once | ORAL | Status: AC | PRN
  Administered 2019-09-14: 650 mg via ORAL
  Filled 2019-09-14: qty 2

## 2019-09-14 NOTE — ED Triage Notes (Addendum)
Patient arrived with complaints of shortness of breath and a nonproductive cough. Reports NVD. States she has had a cold over the last week. Declines taking any OTC mediation. Patient in no distress.

## 2020-04-20 ENCOUNTER — Telehealth: Payer: Medicaid Other | Admitting: Emergency Medicine

## 2020-04-20 DIAGNOSIS — R399 Unspecified symptoms and signs involving the genitourinary system: Secondary | ICD-10-CM

## 2020-04-20 MED ORDER — CEPHALEXIN 500 MG PO CAPS: 500.0000 mg | ORAL_CAPSULE | Freq: Two times a day (BID) | ORAL | 0 refills | Status: AC

## 2020-04-20 NOTE — Progress Notes (Signed)

## 2021-09-10 ENCOUNTER — Telehealth: Payer: Self-pay | Admitting: Physician Assistant

## 2021-09-10 DIAGNOSIS — N3090 Cystitis, unspecified without hematuria: Secondary | ICD-10-CM

## 2021-09-10 MED ORDER — CEPHALEXIN 500 MG PO CAPS: 500.0000 mg | ORAL_CAPSULE | Freq: Two times a day (BID) | ORAL | 0 refills | Status: AC

## 2021-09-10 NOTE — Progress Notes (Signed)

## 2022-06-29 IMAGING — CR DG CHEST 2V
3 series · 3 of 3 positions shown · non-contrast
Comparison: 04/19/2018

CLINICAL DATA: Fever and shortness of breath.

EXAM:
CHEST - 2 VIEW

[w chest pa]
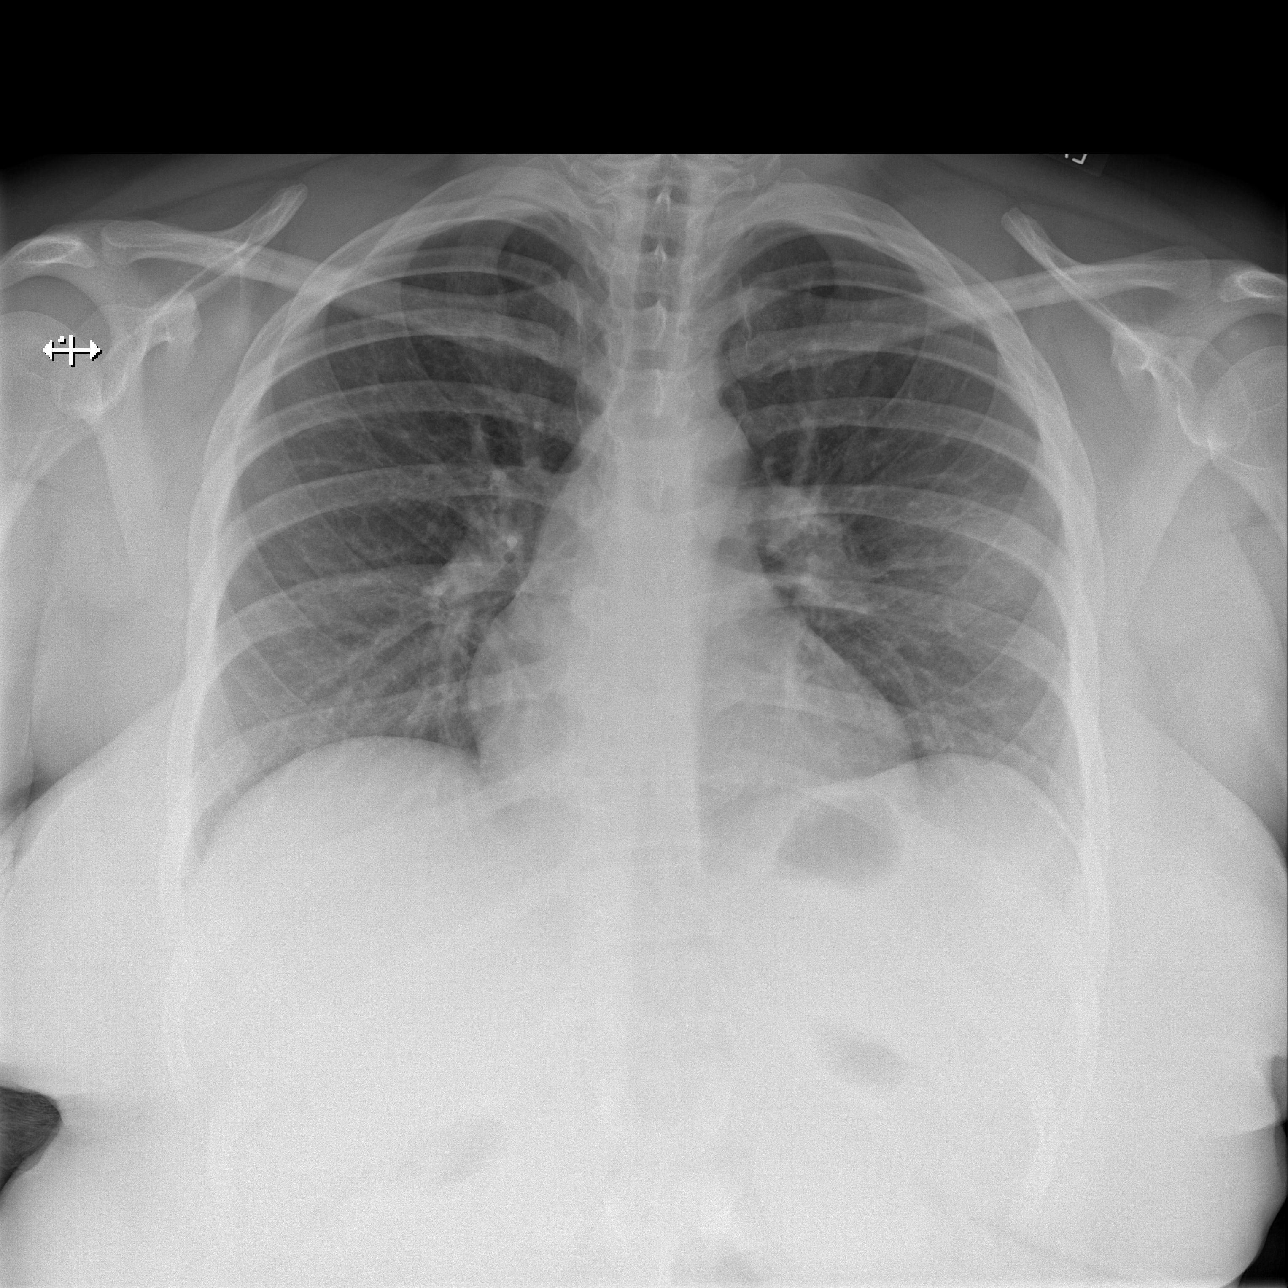

[w chest lat (1 of 2)]
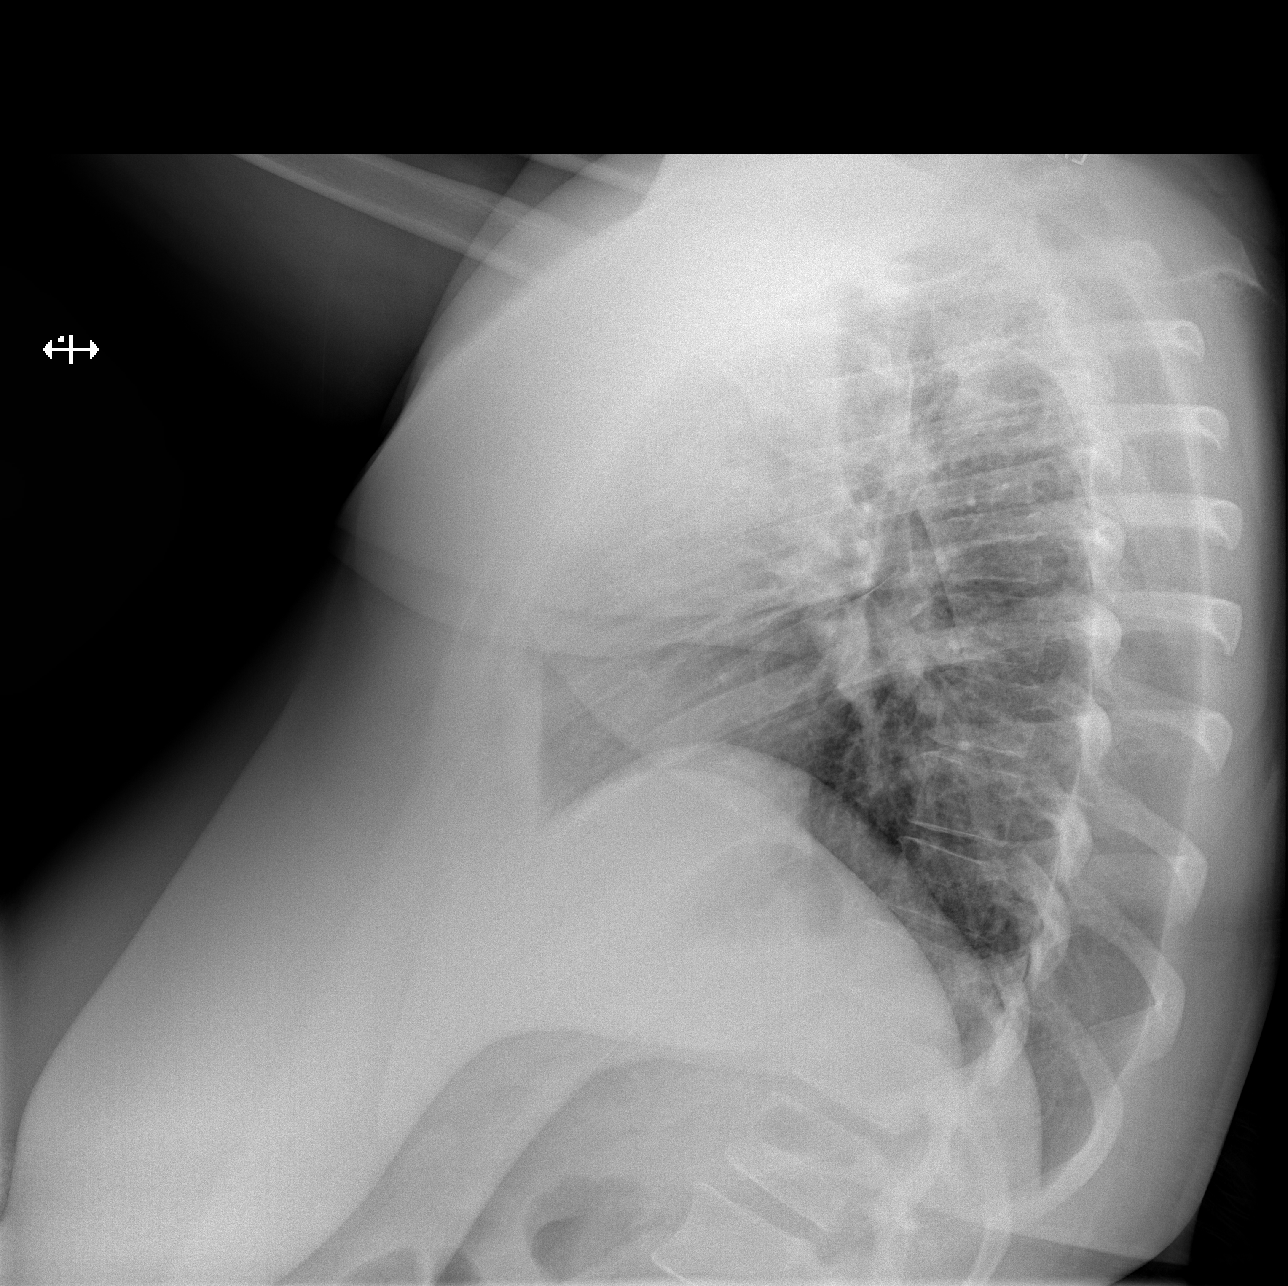

[w chest lat (2 of 2)]
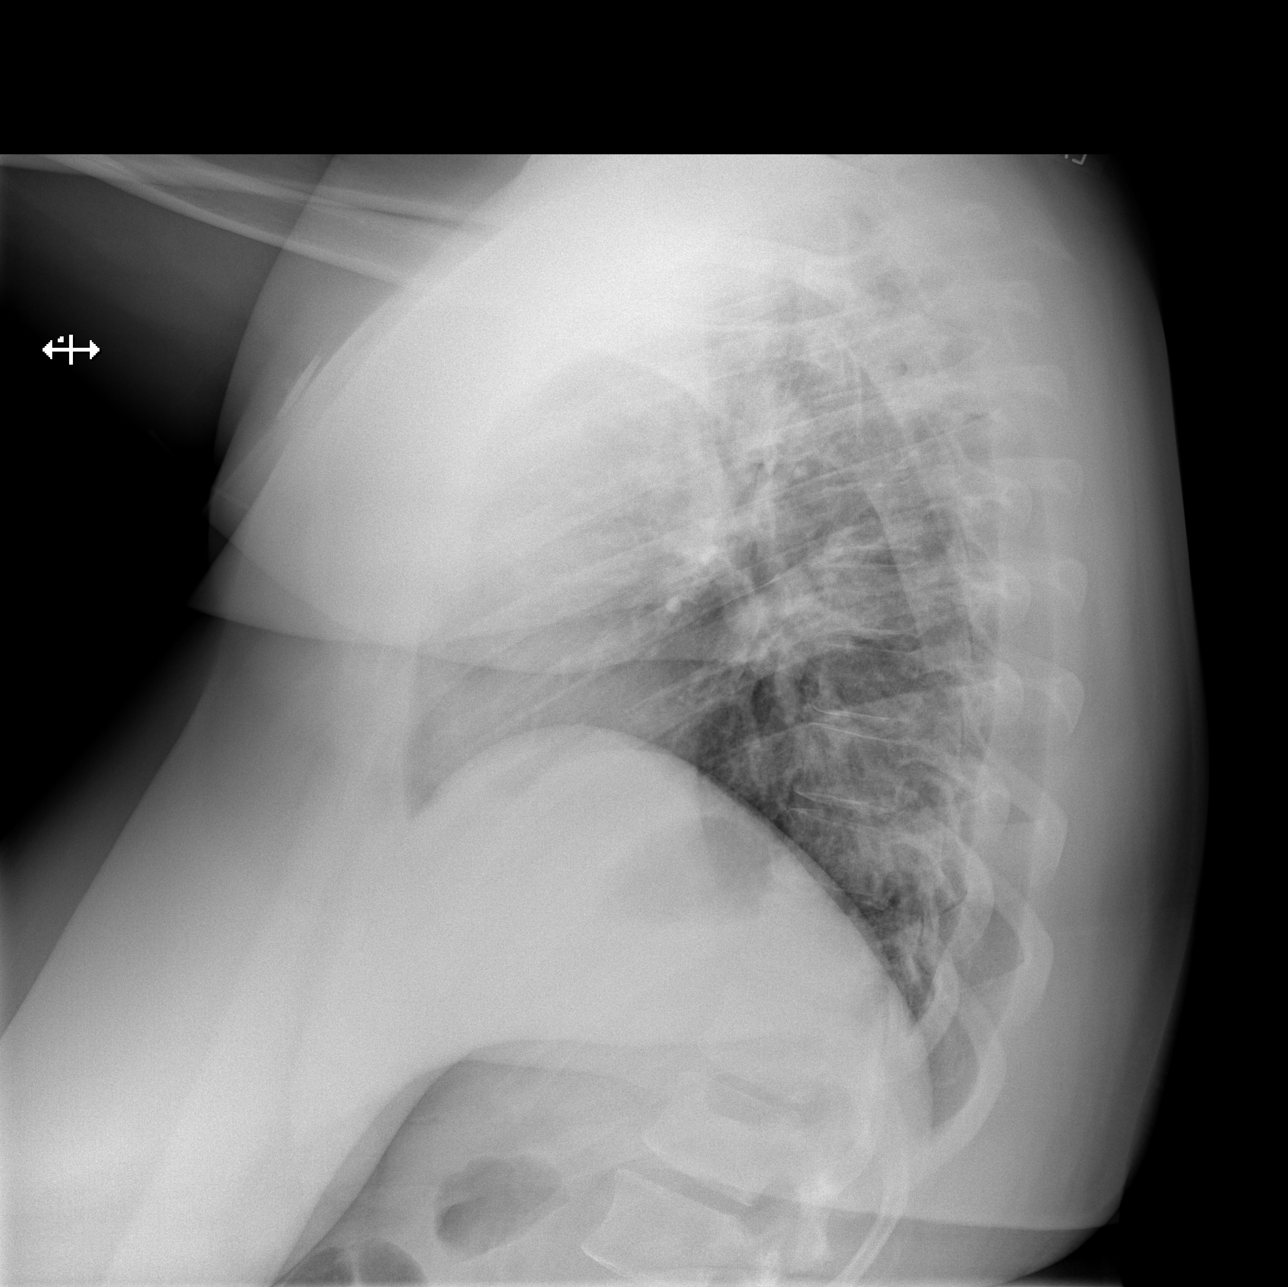

[3 of 3 positions shown; findings below may reference images not displayed]

FINDINGS: The cardiomediastinal contours are normal. Pulmonary vasculature is
normal. No consolidation, pleural effusion, or pneumothorax. No
acute osseous abnormalities are seen.
IMPRESSION: Negative radiographs of the chest.

## 2022-12-01 ENCOUNTER — Telehealth: Payer: Medicaid Other | Admitting: Physician Assistant

## 2022-12-01 DIAGNOSIS — R3989 Other symptoms and signs involving the genitourinary system: Secondary | ICD-10-CM | POA: Diagnosis not present

## 2022-12-01 DIAGNOSIS — R300 Dysuria: Secondary | ICD-10-CM

## 2022-12-01 DIAGNOSIS — N898 Other specified noninflammatory disorders of vagina: Secondary | ICD-10-CM

## 2022-12-01 MED ORDER — NITROFURANTOIN MONOHYD MACRO 100 MG PO CAPS: 100.0000 mg | ORAL_CAPSULE | Freq: Two times a day (BID) | ORAL | 0 refills | Status: DC

## 2022-12-01 NOTE — Progress Notes (Signed)

## 2022-12-02 NOTE — Progress Notes (Signed)
Because you were evaluated yesterday and provider was concerned for a UTI, but now also with more vaginal symptoms that raise concern for BV or yeast/bv combination and need for testing to determine what the actual cause of symptoms is, I feel your condition warrants further evaluation and I recommend that you be seen in a face to face visit.   NOTE: There will be NO CHARGE for this eVisit   If you are having a true medical emergency please call 911.      For an urgent face to face visit, [facility] has eight urgent care centers for your convenience:   NEW!! [facility] Health Urgent Care Center at [facility] Get Driving Directions [phone] [address] [place], [address]    [facility] Health Urgent Care Center at [facility] Get Driving Directions [phone] [address] [place], [place] [address]   [facility] Health Urgent Care Center [facility]) Get Driving Directions [phone] [address] [place], [place] [address]  [facility] Health Urgent Care Center [facility]) Get Driving Directions [phone] [address] [place],  [place]  [address]  [facility] Health Urgent Care Center [facility] - at [employer]  [phone] [address] [place],  [place] [address]   [facility] Health Urgent Care at [facility] Get Driving Directions [phone] [address] [place], [place] [address]   [facility] Health Urgent Care at [facility] Get Driving Directions  [phone] [address] [place], [place] [address]   [facility] Health Urgent Care at [facility] Directions [phone] [address], [address] [place], [place] [address]  Your MyChart E-visit questionnaire answers were reviewed by a board certified advanced clinical practitioner to complete your personal care plan based on your specific symptoms.  Thank you for using e-Visits.

## 2022-12-03 ENCOUNTER — Telehealth: Payer: Medicaid Other | Admitting: Physician Assistant

## 2022-12-03 DIAGNOSIS — N898 Other specified noninflammatory disorders of vagina: Secondary | ICD-10-CM

## 2022-12-03 DIAGNOSIS — R300 Dysuria: Secondary | ICD-10-CM

## 2022-12-03 NOTE — Progress Notes (Signed)
As noted at time of submission of last visit, giving you were evaluated and treated for a UTI but feel symptoms are from a vaginitis, you need an exam and testing to differentiate causes so proper treatment is given. As such, I feel your condition warrants further evaluation and I recommend that you be seen in a face to face visit.   NOTE: There will be NO CHARGE for this eVisit   If you are having a true medical emergency please call 911.      For an urgent face to face visit, [facility] has eight urgent care centers for your convenience:   NEW!! [facility] Health Urgent Care Center at [facility] Get Driving Directions [phone] [address] [place], [address]    [facility] Health Urgent Care Center at [facility] Get Driving Directions [phone] [address] [place], [place] [address]   [facility] Health Urgent Care Center [facility]) Get Driving Directions [phone] [address] [place], [place] [address]  [facility] Health Urgent Care Center [facility]) Get Driving Directions [phone] [address] [place],  [place]  [address]  [facility] Health Urgent Care Center [facility] - at [employer]  [phone] [address] [place],  [place] [address]   [facility] Health Urgent Care at [facility] Get Driving Directions [phone] [address] [place], [place] [address]   [facility] Health Urgent Care at [facility] Get Driving Directions  [phone] [address] [place], [place] [address]   [facility] Health Urgent Care at [facility] Directions [phone] [address] [place], [place] [address]  Your MyChart E-visit questionnaire answers were reviewed by a board certified advanced clinical practitioner to complete your personal care plan based on your specific symptoms.  Thank you for using e-Visits.

## 2022-12-05 ENCOUNTER — Other Ambulatory Visit: Payer: Self-pay | Admitting: Medical Genetics

## 2022-12-05 DIAGNOSIS — Z006 Encounter for examination for normal comparison and control in clinical research program: Secondary | ICD-10-CM

## 2022-12-10 ENCOUNTER — Ambulatory Visit
Admission: EM | Admit: 2022-12-10 | Discharge: 2022-12-10 | Disposition: A | Discharge status: Home / Self Care | Payer: Medicaid Other | Attending: Internal Medicine | Admitting: Internal Medicine

## 2022-12-10 ENCOUNTER — Encounter: Payer: Self-pay | Admitting: Emergency Medicine

## 2022-12-10 DIAGNOSIS — R102 Pelvic and perineal pain: Secondary | ICD-10-CM | POA: Insufficient documentation

## 2022-12-10 DIAGNOSIS — R300 Dysuria: Secondary | ICD-10-CM | POA: Diagnosis not present

## 2022-12-10 LAB — POCT URINALYSIS DIP (MANUAL ENTRY)
Bilirubin, UA: NEGATIVE
Blood, UA: NEGATIVE
Glucose, UA: NEGATIVE mg/dL
Ketones, POC UA: NEGATIVE mg/dL
Leukocytes, UA: NEGATIVE
Nitrite, UA: NEGATIVE
Protein Ur, POC: NEGATIVE mg/dL
Spec Grav, UA: 1.025 (ref 1.010–1.025)
Urobilinogen, UA: 1 U/dL
pH, UA: 6 (ref 5.0–8.0)

## 2022-12-10 LAB — POCT URINE PREGNANCY: Preg Test, Ur: NEGATIVE

## 2022-12-10 MED ORDER — NAPROXEN 375 MG PO TABS: 375.0000 mg | ORAL_TABLET | Freq: Two times a day (BID) | ORAL | 0 refills | Status: DC

## 2022-12-10 NOTE — ED Triage Notes (Signed)
Pt had symptoms that were like UTI symptoms and did e-visit and prescribed medications that finished 2-3 days ago. Pain with urination.   Now having abd pains.

## 2022-12-10 NOTE — Discharge Instructions (Signed)
Make sure you hydrate very well with plain water and a quantity of 80-100 ounces of water a day.  Please limit drinks that are considered urinary irritants such as soda, sweet tea, coffee, energy drinks, alcohol.  These can worsen your urinary and genital symptoms but also be the source of them.  I will let you know about your urine culture and vaginal swab results through MyChart to see if we need to prescribe or change your antibiotics based off of those results.

## 2022-12-10 NOTE — ED Provider Notes (Signed)
[facility]RG[facility]ARE C[facility]  Note:  This document was prepared using Conservation officer, historic buildings and may include unintentional dictation errors.  MRN: [id number] DOB: [birth year]  Subjective:   Christina Mann is a 27 y.o. female presenting for 3-day history of acute onset lower abdominal/pelvic pains.  Initially patient had dysuria.  She had an e-visit and was prescribed Macrobid for UTI symptoms.  No testing was done.  Patient does have some vaginal discharge.  Is not opposed to STI testing.  No concern for pregnancy.  No current facility-administered medications for this encounter.  Current Outpatient Medications:    nitrofurantoin, macrocrystal-monohydrate, (MACROBID) 100 MG capsule, Take 1 capsule (100 mg total) by mouth 2 (two) times daily., Disp: 10 capsule, Rfl: 0   No Known Allergies  Past Medical History:  Diagnosis Date   Medical history non-contributory      Past Surgical History:  Procedure Laterality Date   NO PAST SURGERIES      No family history on file.  Social History   Tobacco Use   Smoking status: Never   Smokeless tobacco: Never  Substance Use Topics   Alcohol use: No   Drug use: No    ROS   Objective:   Vitals: BP 129/88 (BP Location: Left Arm)   Pulse 85   Temp 98.4 F (36.9 C) (Oral)   Resp 17   SpO2 97%   Physical Exam Constitutional:      General: She is not in acute distress.    Appearance: Normal appearance. She is well-developed. She is not ill-appearing, toxic-appearing or diaphoretic.  HENT:     Head: Normocephalic and atraumatic.     Nose: Nose normal.     Mouth/Throat:     Mouth: Mucous membranes are moist.     Pharynx: Oropharynx is clear.  Eyes:     General: No scleral icterus.       Right eye: No discharge.        Left eye: No discharge.     Extraocular Movements: Extraocular movements intact.     Conjunctiva/sclera: Conjunctivae normal.  Cardiovascular:     Rate and Rhythm: Normal rate.   Pulmonary:     Effort: Pulmonary effort is normal.  Abdominal:     General: Bowel sounds are normal. There is no distension.     Palpations: Abdomen is soft. There is no mass.     Tenderness: There is no abdominal tenderness. There is no right CVA tenderness, left CVA tenderness, guarding or rebound.  Skin:    General: Skin is warm and dry.  Neurological:     General: No focal deficit present.     Mental Status: She is alert and oriented to person, place, and time.  Psychiatric:        Mood and Affect: Mood normal.        Behavior: Behavior normal.        Thought Content: Thought content normal.        Judgment: Judgment normal.     Results for orders placed or performed during the hospital encounter of 12/10/22 (from the past 24 hour(s))  POCT urinalysis dipstick     Status: Abnormal   Collection Time: 12/10/22  7:16 PM  Result Value Ref Range   Color, UA straw (A) yellow   Clarity, UA hazy (A) clear   Glucose, UA negative negative mg/dL   Bilirubin, UA negative negative   Ketones, POC UA negative negative mg/dL   Spec Grav, UA  1.025 1.010 - 1.025   Blood, UA negative negative   pH, UA 6.0 5.0 - 8.0   Protein Ur, POC negative negative mg/dL   Urobilinogen, UA 1.0 0.2 or 1.0 E.U./dL   Nitrite, UA Negative Negative   Leukocytes, UA Negative Negative  POCT urine pregnancy     Status: None   Collection Time: 12/10/22  7:16 PM  Result Value Ref Range   Preg Test, Ur Negative Negative    Assessment and Plan :   PDMP not reviewed this encounter.  1. Acute pelvic pain, female   2. Dysuria    Low suspicion for PID, obstructive uropathy, and acute gynecologic emergency.  Will base treatment off of results.  Naproxen in the meantime for pelvic pains.  Counseled patient on potential for adverse effects with medications prescribed/recommended today, ER and return-to-clinic precautions discussed, patient verbalized understanding.    Wallis Bamberg, [place] 12/11/22 [phone]

## 2022-12-11 ENCOUNTER — Telehealth: Payer: Self-pay

## 2022-12-11 LAB — CERVICOVAGINAL ANCILLARY ONLY
Bacterial Vaginitis (gardnerella): POSITIVE — AB
Candida Glabrata: NEGATIVE
Candida Vaginitis: NEGATIVE
Chlamydia: NEGATIVE
Comment: NEGATIVE
Comment: NEGATIVE
Comment: NEGATIVE
Comment: NEGATIVE
Comment: NEGATIVE
Comment: NORMAL
Neisseria Gonorrhea: NEGATIVE
Trichomonas: NEGATIVE

## 2022-12-11 MED ORDER — METRONIDAZOLE 500 MG PO TABS: 500.0000 mg | ORAL_TABLET | Freq: Two times a day (BID) | ORAL | 0 refills | Status: AC

## 2022-12-11 NOTE — Telephone Encounter (Signed)
Per protocol, pt requires tx with metronidazole. Reviewed with patient, verified pharmacy, prescription sent.

## 2022-12-12 LAB — URINE CULTURE

## 2022-12-22 ENCOUNTER — Telehealth: Payer: Medicaid Other | Admitting: Physician Assistant

## 2022-12-22 DIAGNOSIS — N898 Other specified noninflammatory disorders of vagina: Secondary | ICD-10-CM

## 2022-12-22 DIAGNOSIS — R109 Unspecified abdominal pain: Secondary | ICD-10-CM

## 2022-12-22 NOTE — Progress Notes (Signed)
Because you are still having symptoms after completing treatment , I feel your condition warrants further evaluation and I recommend that you be seen in a face to face visit.  Recommend you are retested to confirm continued BV infection versus other causes of persistent vaginal discharge and abdominal pain.    NOTE: There will be NO CHARGE for this eVisit   If you are having a true medical emergency please call 911.      For an urgent face to face visit, [facility] has eight urgent care centers for your convenience:   NEW!! [facility] Health Urgent Care Center at [facility] Get Driving Directions [phone] [address] [place], [address]    [facility] Health Urgent Care Center at [facility] Get Driving Directions [phone] [address] [place], [place] [address]   [facility] Health Urgent Care Center [facility]) Get Driving Directions [phone] [address] [place], [place] [address]  [facility] Health Urgent Care Center [facility] [place]) Get Driving Directions [phone] [address] [place],  [place]  [address]  [facility] Health Urgent Care Center [facility] - at [employer]  [phone] [address] [place],  [place] [address]   [facility] Health Urgent Care at [facility] Get Driving Directions [phone] [address] [place], [place] [address]   [facility] Health Urgent Care at [facility] Get Driving Directions  [phone] [address] [place], [place] [address]   [facility] Health Urgent Care at [facility] Directions [phone] [address] [place], [place] [address]  Your MyChart E-visit questionnaire answers were reviewed by a board certified advanced clinical practitioner to complete your personal care plan based on your specific symptoms.  Thank you for using e-Visits.

## 2022-12-23 ENCOUNTER — Ambulatory Visit (HOSPITAL_COMMUNITY): Payer: Medicaid Other

## 2022-12-24 ENCOUNTER — Ambulatory Visit (HOSPITAL_COMMUNITY): Payer: Medicaid Other

## 2023-01-05 ENCOUNTER — Ambulatory Visit (HOSPITAL_COMMUNITY)
Admission: RE | Admit: 2023-01-05 | Discharge: 2023-01-05 | Disposition: A | Discharge status: Home / Self Care | Payer: Medicaid Other | Source: Ambulatory Visit | Attending: Emergency Medicine | Admitting: Emergency Medicine

## 2023-01-05 ENCOUNTER — Encounter (HOSPITAL_COMMUNITY): Payer: Self-pay

## 2023-01-05 VITALS — BP 113/80 | HR 114 | Temp 100.0°F | Resp 16 | Ht 67.0 in | Wt 290.0 lb

## 2023-01-05 DIAGNOSIS — Z113 Encounter for screening for infections with a predominantly sexual mode of transmission: Secondary | ICD-10-CM

## 2023-01-05 DIAGNOSIS — J039 Acute tonsillitis, unspecified: Secondary | ICD-10-CM | POA: Diagnosis present

## 2023-01-05 DIAGNOSIS — N898 Other specified noninflammatory disorders of vagina: Secondary | ICD-10-CM

## 2023-01-05 LAB — POCT URINALYSIS DIP (MANUAL ENTRY)
Bilirubin, UA: NEGATIVE
Blood, UA: NEGATIVE
Glucose, UA: NEGATIVE mg/dL
Ketones, POC UA: NEGATIVE mg/dL
Leukocytes, UA: NEGATIVE
Nitrite, UA: NEGATIVE
Protein Ur, POC: 100 mg/dL — AB
Spec Grav, UA: >=1.03 — AB (ref 1.010–1.025)
Urobilinogen, UA: 1 U/dL
pH, UA: 5.5 (ref 5.0–8.0)

## 2023-01-05 LAB — POCT URINE PREGNANCY: Preg Test, Ur: NEGATIVE

## 2023-01-05 LAB — POCT RAPID STREP A (OFFICE): Rapid Strep A Screen: NEGATIVE

## 2023-01-05 MED ORDER — AMOXICILLIN 500 MG PO CAPS: 500.0000 mg | ORAL_CAPSULE | Freq: Two times a day (BID) | ORAL | 0 refills | Status: AC

## 2023-01-05 NOTE — ED Triage Notes (Signed)
Patient here today with c/o ST, runny nose, sweats, and swollen tonsils X 3-4 days. She has been taking Advil. Her friend has also been sick.   She is also having symptoms of vaginal itching, discharge, and discomfort X 1 month. Patient recently tested positive for BV and states that she completed her metronidazole but still having symptoms.

## 2023-01-05 NOTE — ED Provider Notes (Signed)
[facility]    CSN: [id number] Arrival date & time: 01/05/23  1521     History   Chief Complaint Chief Complaint  Patient presents with   Sore Throat    Reason for visit , Tonsillitis possibly? Also visit for bacterial vaginosis - Entered by patient   Vaginal Discharge    HPI Christina Mann is a 27 y.o. female.  3-4 day history of sore throat, rating 7/10 pain with swallowing She feels the right side is more swollen No cough. No fevers but felt hot Has been using Advil and Aleve with temporary relief  Also reporting vaginal discomfort, irritation She was treated for BV 11/13.  She finished all of the medication and symptoms improved.  However she is still having some discomfort Would like STD testing LMP 11/3 Slight suprapubic pressure with urination. No dysuria, hematuria, urgency, frequency, or flank pain  Past Medical History:  Diagnosis Date   Medical history non-contributory     Patient Active Problem List   Diagnosis Date Noted   Fetal heart rate/rhythm abnormality affecting management of mother 06/15/2016   Pregnancy with adoption planned, currently in third trimester 04/28/2016   Supervision of normal first pregnancy, antepartum 03/13/2016   Insufficient prenatal care, third trimester 03/13/2016    Past Surgical History:  Procedure Laterality Date   NO PAST SURGERIES      OB History     Gravida  1   Para  1   Term  1   Preterm      AB      Living  1      SAB      IAB      Ectopic      Multiple  0   Live Births  1            Home Medications    Prior to Admission medications   Medication Sig Start Date End Date Taking? Authorizing Provider  amoxicillin (AMOXIL) 500 MG capsule Take 1 capsule (500 mg total) by mouth 2 (two) times daily for 10 days. 01/05/23 01/15/23 Yes Essynce Munsch, Ray Church    Family History History reviewed. No pertinent family history.  Social History Social History   Tobacco Use    Smoking status: Never   Smokeless tobacco: Never  Vaping Use   Vaping status: Never Used  Substance Use Topics   Alcohol use: Yes    Comment: socially   Drug use: No     Allergies   Patient has no known allergies.   Review of Systems Review of Systems  Genitourinary:  Positive for vaginal discharge.   Per HPI  Physical Exam Triage Vital Signs ED Triage Vitals  Encounter Vitals Group     BP 01/05/23 1541 113/80     Systolic BP Percentile --      Diastolic BP Percentile --      Pulse Rate 01/05/23 1541 (!) 119     Resp 01/05/23 1541 16     Temp 01/05/23 1541 99 F (37.2 C)     Temp Source 01/05/23 1541 Oral     SpO2 01/05/23 1541 96 %     Weight 01/05/23 1541 290 lb (131.5 kg)     Height 01/05/23 1541 5\' 7"  (1.702 m)     Head Circumference --      Peak Flow --      Pain Score 01/05/23 1540 5     Pain Loc --      Pain Education --  Exclude from Growth Chart --    No data found.  Updated Vital Signs BP 113/80 (BP Location: Left Arm)   Pulse (!) 114   Temp 100 F (37.8 C) (Oral)   Resp 16   Ht 5\' 7"  (1.702 m)   Wt 290 lb (131.5 kg)   LMP 11/30/2022 (Approximate)   SpO2 96%   Breastfeeding No   BMI 45.42 kg/m    Physical Exam Vitals and nursing note reviewed.  Constitutional:      General: She is not in acute distress.    Appearance: Normal appearance.  HENT:     Right Ear: Tympanic membrane and ear canal normal.     Left Ear: Tympanic membrane and ear canal normal.     Nose: No congestion or rhinorrhea.     Mouth/Throat:     Pharynx: Oropharynx is clear. Posterior oropharyngeal erythema present. No pharyngeal swelling.     Tonsils: Tonsillar exudate present. 2+ on the right. 2+ on the left.     Comments: Normal phonation, tolerating secretions Eyes:     Conjunctiva/sclera: Conjunctivae normal.  Cardiovascular:     Rate and Rhythm: Normal rate and regular rhythm.     Pulses: Normal pulses.     Heart sounds: Normal heart sounds.  Pulmonary:      Effort: Pulmonary effort is normal.     Breath sounds: Normal breath sounds.  Abdominal:     Palpations: Abdomen is soft.     Tenderness: There is no abdominal tenderness.  Lymphadenopathy:     Cervical: No cervical adenopathy.  Skin:    General: Skin is warm and dry.  Neurological:     Mental Status: She is alert and oriented to person, place, and time.      UC Treatments / Results  Labs (all labs ordered are listed, but only abnormal results are displayed) Labs Reviewed  POCT URINALYSIS DIP (MANUAL ENTRY) - Abnormal; Notable for the following components:      Result Value   Color, UA other (*)    Clarity, UA cloudy (*)    Spec Grav, UA >=1.030 (*)    Protein Ur, POC =100 (*)    All other components within normal limits  CULTURE, GROUP A STREP [facility])  POCT RAPID STREP A (OFFICE)  POCT URINE PREGNANCY  CERVICOVAGINAL ANCILLARY ONLY    EKG  Radiology No results found.  Procedures Procedures   Medications Ordered in UC Medications - No data to display  Initial Impression / Assessment and Plan / UC Course  I have reviewed the triage vital signs and the nursing notes.  Pertinent labs & imaging results that were available during my care of the patient were reviewed by me and considered in my medical decision making (see chart for details).  Temp 100 at discharge and HR 110, suspect tachycardic from encroaching fever. Offered tylenol dose in clinic but patient will take at home.   Rapid strep negative.  Culture is pending With throat exam, treat for tonsillitis with amoxicillin twice a day for 10 days.  Discussed other symptomatic care in the meantime  UPT negative UA elevated spec grav, no sign of infection. Increase fluids.  Cytology swab is pending. If BV positive will go ahead and treat again. Discussed could have abx induced yeast infection. Will await test  Return precautions and follow with primary advised   Final Clinical Impressions(s) / UC Diagnoses    Final diagnoses:  Acute tonsillitis, unspecified etiology  Vaginal discharge  Screen for  STD (sexually transmitted disease)     Discharge Instructions      I am treating you for tonsillitis; take the amoxicillin twice daily for 10 days. Please change your toothbrush! Continue other symptomatic care as needed  We will call you if anything on your swab returns positive. You can also see these results on MyChart. Please abstain from sexual intercourse until your results return.  Your urine does not show signs of infection, but is concentrated suggesting dehydration. Please drink lots of water!     ED Prescriptions     Medication Sig Dispense Auth. Provider   amoxicillin (AMOXIL) 500 MG capsule Take 1 capsule (500 mg total) by mouth 2 (two) times daily for 10 days. 20 capsule Shakora Nordquist, Lurena Joiner, PA-C      PDMP not reviewed this encounter.   Quindell Shere, Lurena Joiner, [place] 01/05/23 1722

## 2023-01-05 NOTE — Discharge Instructions (Addendum)
I am treating you for tonsillitis; take the amoxicillin twice daily for 10 days. Please change your toothbrush! Continue other symptomatic care as needed  We will call you if anything on your swab returns positive. You can also see these results on MyChart. Please abstain from sexual intercourse until your results return.  Your urine does not show signs of infection, but is concentrated suggesting dehydration. Please drink lots of water!

## 2023-01-06 LAB — CERVICOVAGINAL ANCILLARY ONLY
Bacterial Vaginitis (gardnerella): NEGATIVE
Candida Glabrata: NEGATIVE
Candida Vaginitis: NEGATIVE
Chlamydia: NEGATIVE
Comment: NEGATIVE
Comment: NEGATIVE
Comment: NEGATIVE
Comment: NEGATIVE
Comment: NEGATIVE
Comment: NORMAL
Neisseria Gonorrhea: NEGATIVE
Trichomonas: NEGATIVE

## 2023-01-07 LAB — CULTURE, GROUP A STREP (THRC)

## 2023-03-23 ENCOUNTER — Ambulatory Visit
Admission: RE | Admit: 2023-03-23 | Discharge: 2023-03-23 | Disposition: A | Discharge status: Home / Self Care | Payer: Medicaid Other | Source: Ambulatory Visit | Attending: Family Medicine | Admitting: Family Medicine

## 2023-03-23 VITALS — BP 148/97 | HR 99 | Temp 99.0°F | Resp 16

## 2023-03-23 DIAGNOSIS — L089 Local infection of the skin and subcutaneous tissue, unspecified: Secondary | ICD-10-CM | POA: Diagnosis not present

## 2023-03-23 MED ORDER — NAPROXEN 500 MG PO TABS: 500.0000 mg | ORAL_TABLET | Freq: Two times a day (BID) | ORAL | 0 refills | Status: DC

## 2023-03-23 MED ORDER — AMOXICILLIN-POT CLAVULANATE 875-125 MG PO TABS: 1.0000 | ORAL_TABLET | Freq: Two times a day (BID) | ORAL | 0 refills | Status: DC

## 2023-03-23 NOTE — ED Provider Notes (Signed)
  [facility]ARE [facility]  Note:  This document was prepared using Conservation officer, historic buildings and may include unintentional dictation errors.  MRN: [id number] DOB: 03/05/1995  Subjective:   Christina Mann is a 28 y.o. female presenting for 5-day history of acute onset persistent upper lip swelling around her piercing.  She has had drainage as well with fever and body pains.  Has had the piercing for a year.  Reports that it healed nicely.  Pain is currently mild.  No current facility-administered medications for this encounter.  Current Outpatient Medications:    naproxen sodium (ALEVE) 220 MG tablet, Take 220 mg by mouth., Disp: , Rfl:    No Known Allergies  Past Medical History:  Diagnosis Date   Medical history non-contributory      Past Surgical History:  Procedure Laterality Date   NO PAST SURGERIES      History reviewed. No pertinent family history.  Social History   Tobacco Use   Smoking status: Never   Smokeless tobacco: Never  Vaping Use   Vaping status: Never Used  Substance Use Topics   Alcohol use: Yes    Comment: socially   Drug use: Never    ROS   Objective:   Vitals: BP (!) 148/97 (BP Location: Right Arm)   Pulse 99   Temp 99 F (37.2 C) (Oral)   Resp 16   LMP 03/19/2023 (Exact Date)   SpO2 96%   Physical Exam Constitutional:      General: She is not in acute distress.    Appearance: Normal appearance. She is well-developed. She is not ill-appearing, toxic-appearing or diaphoretic.  HENT:     Head: Normocephalic and atraumatic.     Nose: Nose normal.     Mouth/Throat:     Mouth: Mucous membranes are moist.   Eyes:     General: No scleral icterus.       Right eye: No discharge.        Left eye: No discharge.     Extraocular Movements: Extraocular movements intact.  Cardiovascular:     Rate and Rhythm: Normal rate.  Pulmonary:     Effort: Pulmonary effort is normal.  Skin:    General: Skin is warm and dry.   Neurological:     General: No focal deficit present.     Mental Status: She is alert and oriented to person, place, and time.  Psychiatric:        Mood and Affect: Mood normal.        Behavior: Behavior normal.     Assessment and Plan :   PDMP not reviewed this encounter.  1. Facial infection    I advised patient remove the piercing but she is not inclined to do so and concerned that the infection would worsen.  Recommended starting Augmentin, naproxen for pain and inflammation.  Advised that if she worsens, presents to the emergency room.   Wallis Bamberg, [place] 03/23/23 1622

## 2023-03-23 NOTE — ED Triage Notes (Signed)
 Pt reports swelling, pus, on the upper lip, body aches and fever due to piercing x 5 days. Aleve gives no relief.

## 2023-03-23 NOTE — Discharge Instructions (Signed)
 Start Augmentin today to address infection of the piercing of your lip. Use naproxen for pain and inflammation. If worse or no response in 48 hours please go to the emergency room.

## 2023-04-28 ENCOUNTER — Telehealth: Admitting: Physician Assistant

## 2023-04-28 DIAGNOSIS — R3989 Other symptoms and signs involving the genitourinary system: Secondary | ICD-10-CM | POA: Diagnosis not present

## 2023-04-28 MED ORDER — NITROFURANTOIN MONOHYD MACRO 100 MG PO CAPS: 100.0000 mg | ORAL_CAPSULE | Freq: Two times a day (BID) | ORAL | 0 refills | Status: DC

## 2023-04-28 NOTE — Progress Notes (Signed)

## 2023-04-28 NOTE — Progress Notes (Signed)
 I have spent 5 minutes in review of e-visit questionnaire, review and updating patient chart, medical decision making and response to patient.   Piedad Climes, PA-C

## 2023-05-09 ENCOUNTER — Telehealth: Admitting: Family

## 2023-05-09 DIAGNOSIS — N390 Urinary tract infection, site not specified: Secondary | ICD-10-CM

## 2023-05-09 NOTE — Progress Notes (Signed)
  Because of your recurrent UTI, I feel your condition warrants further evaluation and I recommend that you be seen in a face-to-face visit.   NOTE: There will be NO CHARGE for this E-Visit   If you are having a true medical emergency, please call 911.     For an urgent face to face visit, [facility] [place] has multiple urgent care centers for your convenience.  Click the link below for the full list of locations and hours, walk-in wait times, appointment scheduling options and driving directions:  Urgent Care - [place], [place], [place], [place], [place], [place]  [place]     Your MyChart E-visit questionnaire answers were reviewed by a board certified advanced clinical practitioner to complete your personal care plan based on your specific symptoms.    Thank you for using e-Visits.

## 2023-05-12 ENCOUNTER — Ambulatory Visit
Admission: RE | Admit: 2023-05-12 | Discharge: 2023-05-12 | Disposition: A | Discharge status: Home / Self Care | Source: Ambulatory Visit | Attending: Family Medicine | Admitting: Family Medicine

## 2023-05-12 VITALS — BP 123/88 | HR 90 | Temp 99.0°F | Resp 18

## 2023-05-12 DIAGNOSIS — N390 Urinary tract infection, site not specified: Secondary | ICD-10-CM | POA: Diagnosis present

## 2023-05-12 LAB — POCT URINE PREGNANCY: Preg Test, Ur: NEGATIVE

## 2023-05-12 LAB — POCT URINALYSIS DIP (MANUAL ENTRY)
Bilirubin, UA: NEGATIVE
Glucose, UA: NEGATIVE mg/dL
Ketones, POC UA: NEGATIVE mg/dL
Nitrite, UA: NEGATIVE
Protein Ur, POC: NEGATIVE mg/dL
Spec Grav, UA: 1.02
Urobilinogen, UA: 0.2 U/dL
pH, UA: 6

## 2023-05-12 MED ORDER — CEPHALEXIN 500 MG PO CAPS: 500.0000 mg | ORAL_CAPSULE | Freq: Two times a day (BID) | ORAL | 0 refills | Status: AC

## 2023-05-12 NOTE — ED Provider Notes (Signed)
 [facility]    CSN: [id number] Arrival date & time: 05/12/23  1646      History   Chief Complaint Chief Complaint  Patient presents with   Urinary Frequency    Suspected UTI did a E visit and was prescribed 100 mg of antibiotic . Did not help. - Entered by patient    HPI Christina Mann is a 28 y.o. female.  Christina Mann is a 28 y.o. female presents for evaluation of urinary frequency, urgency and cloudy, >1 week. Treated with Macrobid telehealth visit where patient had no urinalysis completed.  She reports symptoms temporarily improved but never went away and symptoms are currently worsening.  She denies any fever, chills or ear vomiting.  Last urine culture collected here at urgent care did yield any bacteria or growth. Past Medical History:  Diagnosis Date   Medical history non-contributory     Patient Active Problem List   Diagnosis Date Noted   Fetal heart rate/rhythm abnormality affecting management of mother 06/15/2016   Pregnancy with adoption planned, currently in third trimester 04/28/2016   Supervision of normal first pregnancy, antepartum 03/13/2016   Insufficient prenatal care, third trimester 03/13/2016    Past Surgical History:  Procedure Laterality Date   NO PAST SURGERIES      OB History     Gravida  1   Para  1   Term  1   Preterm      AB      Living  1      SAB      IAB      Ectopic      Multiple  0   Live Births  1            Home Medications    Prior to Admission medications   Medication Sig Start Date End Date Taking? Authorizing Provider  cephALEXin (KEFLEX) 500 MG capsule Take 1 capsule (500 mg total) by mouth 2 (two) times daily for 3 days. 05/12/23 05/15/23 Yes Bing Neighbors, NP  nitrofurantoin, macrocrystal-monohydrate, (MACROBID) 100 MG capsule Take 1 capsule (100 mg total) by mouth 2 (two) times daily. 04/28/23  Yes Waldon Merl, PA-C    Family History No family history on  file.  Social History Social History   Tobacco Use   Smoking status: Never   Smokeless tobacco: Never  Vaping Use   Vaping status: Never Used  Substance Use Topics   Alcohol use: Yes    Comment: socially   Drug use: Never     Allergies   Patient has no known allergies.   Review of Systems Review of Systems  Genitourinary:  Positive for frequency.     Physical Exam Triage Vital Signs ED Triage Vitals  Encounter Vitals Group     BP 05/12/23 1712 123/88     Systolic BP Percentile --      Diastolic BP Percentile --      Pulse Rate 05/12/23 1712 90     Resp 05/12/23 1712 18     Temp 05/12/23 1712 99 F (37.2 C)     Temp Source 05/12/23 1712 Oral     SpO2 05/12/23 1712 98 %     Weight --      Height --      Head Circumference --      Peak Flow --      Pain Score 05/12/23 1711 2     Pain Loc --  Pain Education --      Exclude from Growth Chart --    No data found.  Updated Vital Signs BP 123/88 (BP Location: Right Arm)   Pulse 90   Temp 99 F (37.2 C) (Oral)   Resp 18   LMP  (LMP Unknown)   SpO2 98%   Visual Acuity Right Eye Distance:   Left Eye Distance:   Bilateral Distance:    Right Eye Near:   Left Eye Near:    Bilateral Near:     Physical Exam General appearance: alert, well developed, well nourished, cooperative and in no distress Head: Normocephalic, without obvious abnormality, atraumatic Respiratory: Respirations even and unlabored, normal respiratory rate Heart: rate and rhythm normal.   CVA:  no flank pain Extremities: No gross deformities Skin: Skin color, texture, turgor normal. No rashes seen  Psych: Appropriate mood and affect.  UC Treatments / Results  Labs (all labs ordered are listed, but only abnormal results are displayed) Labs Reviewed  POCT URINALYSIS DIP (MANUAL ENTRY) - Abnormal; Notable for the following components:      Result Value   Clarity, UA cloudy (*)    Blood, UA trace-intact (*)    Leukocytes, UA  Small (1+) (*)    All other components within normal limits  POCT URINE PREGNANCY - Normal  URINE CULTURE    EKG   Radiology No results found.  Procedures Procedures (including critical care time)  Medications Ordered in UC Medications - No data to display  Initial Impression / Assessment and Plan / UC Course  I have reviewed the triage vital signs and the nursing notes.  Pertinent labs & imaging results that were available during my care of the patient were reviewed by me and considered in my medical decision making (see chart for details).     Current UTI, UA shows small leuks and cloudy urine however based on patient's symptoms suspect cystitis versus acute recurrent UTI.   Treating for UTI with Keflex 500 mg twice daily for 3 days  while awaiting urine culture results.  Hydrate well with fluids. Return as needed. Final Clinical Impressions(s) / UC Diagnoses   Final diagnoses:  Recurrent UTI     Discharge Instructions      Your urine culture will result within 3 days.  We will notify you if there is any changes in treatment based on urine culture results.  If you do not hear anything from our clinic continue with current treatment as prescribed.  Hydrate well with fluids.  If you develop any fever, nausea or vomiting or severe abdominal pain go immediately to the emergency department.    ED Prescriptions     Medication Sig Dispense Auth. Provider   cephALEXin (KEFLEX) 500 MG capsule Take 1 capsule (500 mg total) by mouth 2 (two) times daily for 3 days. 6 capsule Buena Carmine, NP      PDMP not reviewed this encounter.   Buena Carmine, NP 05/12/23 [phone]

## 2023-05-12 NOTE — ED Triage Notes (Signed)
 Suspected UTI did a E visit and was prescribed 100 mg of antibiotic . Did not help. - Entered by patient  Pt c/o sxs x 1 week.

## 2023-05-12 NOTE — Discharge Instructions (Signed)
Your urine culture will result within 3 days.  We will notify you if there is any changes in treatment based on urine culture results.  If you do not hear anything from our clinic continue with current treatment as prescribed.  Hydrate well with fluids.  If you develop any fever, nausea or vomiting or severe abdominal pain go immediately to the emergency department.

## 2023-05-14 LAB — URINE CULTURE
Culture: >=100000 — AB
Special Requests: NORMAL

## 2023-05-20 ENCOUNTER — Ambulatory Visit

## 2023-10-06 ENCOUNTER — Telehealth: Admitting: Physician Assistant

## 2023-10-06 DIAGNOSIS — L089 Local infection of the skin and subcutaneous tissue, unspecified: Secondary | ICD-10-CM | POA: Diagnosis not present

## 2023-10-06 DIAGNOSIS — S01531A Puncture wound without foreign body of lip, initial encounter: Secondary | ICD-10-CM

## 2023-10-06 MED ORDER — CEPHALEXIN 500 MG PO CAPS: 500.0000 mg | ORAL_CAPSULE | Freq: Three times a day (TID) | ORAL | 0 refills | Status: AC

## 2023-10-06 NOTE — Progress Notes (Signed)
 Virtual Visit Consent   Christina Mann, you are scheduled for a virtual visit with a [facility] provider today. Just as with appointments in the office, your consent must be obtained to participate. Your consent will be active for this visit and any virtual visit you may have with one of our providers in the next 365 days. If you have a MyChart account, a copy of this consent can be sent to you electronically.  As this is a virtual visit, video technology does not allow for your provider to perform a traditional examination. This may limit your provider's ability to fully assess your condition. If your provider identifies any concerns that need to be evaluated in person or the need to arrange testing (such as labs, EKG, etc.), we will make arrangements to do so. Although advances in technology are sophisticated, we cannot ensure that it will always work on either your end or our end. If the connection with a video visit is poor, the visit may have to be switched to a telephone visit. With either a video or telephone visit, we are not always able to ensure that we have a secure connection.  By engaging in this virtual visit, you consent to the provision of healthcare and authorize for your insurance to be billed (if applicable) for the services provided during this visit. Depending on your insurance coverage, you may receive a charge related to this service.  I need to obtain your verbal consent now. Are you willing to proceed with your visit today? WAKISHA ALBERTS has provided verbal consent on 10/06/2023 for a virtual visit (video or telephone). Elsie Velma Lunger, [place]  Date: 10/06/2023 2:38 PM   Virtual Visit via Video Note   I, Elsie Velma Lunger, connected with  Christina Mann  ([id number], 05-21-1995) on 10/06/23 at  2:30 PM EDT by a video-enabled telemedicine application and verified that I am speaking with the correct person using two identifiers.  Location: Patient: Virtual Visit  Location Patient: Home Provider: Virtual Visit Location Provider: Home Office   I discussed the limitations of evaluation and management by telemedicine and the availability of in person appointments. The patient expressed understanding and agreed to proceed.    History of Present Illness: Christina Mann is a 28 y.o. who identifies as a female who was assigned female at birth, and is being seen today for concerns about an infected piercing. Endorses 3 days ago noticing redness and swelling at site of piercing of her upper lip. Yesterday starting to note purulent drainage from the area. Denies fever, chill, malaise. Has had this piercing for over a year. Does not periodically remove for cleaning. Denies any known trauma or injury. Denies concerns for pregnancy.   HPI: HPI  Problems:  There are no active problems to display for this patient.   Allergies: No Known Allergies Medications:  Current Outpatient Medications:    cephALEXin  (KEFLEX ) 500 MG capsule, Take 1 capsule (500 mg total) by mouth 3 (three) times daily for 7 days., Disp: 21 capsule, Rfl: 0  Observations/Objective: Patient is well-developed, well-nourished in no acute distress.  Resting comfortably  at home.  Head is normocephalic, atraumatic.  No labored breathing.  Speech is clear and coherent with logical content.  Patient is alert and oriented at baseline.  Upper mid lip with erythema and mild swelling extending above the vermilion border. No noted fluctuance or active drainage.  Assessment and Plan: 1. Piercing in middle third of upper lip (Primary)  2.  Skin infection - cephALEXin  (KEFLEX ) 500 MG capsule; Take 1 capsule (500 mg total) by mouth 3 (three) times daily for 7 days.  Dispense: 21 capsule; Refill: 0  Supportive measures and OTC medications reviewed. Keflex  TID x 7 days. In person evaluation for any non-resolving, new or worsening symptoms despite treatment.  Follow Up Instructions: I discussed the  assessment and treatment plan with the patient. The patient was provided an opportunity to ask questions and all were answered. The patient agreed with the plan and demonstrated an understanding of the instructions.  A copy of instructions were sent to the patient via MyChart unless otherwise noted below.   The patient was advised to call back or seek an in-person evaluation if the symptoms worsen or if the condition fails to improve as anticipated.    Elsie Velma Lunger, PA-C

## 2023-10-06 NOTE — Patient Instructions (Signed)
  Christina Mann, thank you for joining Christina Velma Lunger, PA-C for today's virtual visit.  While this provider is not your primary care provider (PCP), if your PCP is located in our provider database this encounter information will be shared with them immediately following your visit.   A [facility] MyChart account gives you access to today's visit and all your visits, tests, and labs performed at [facility]  click here if you don't have a [facility] MyChart account or go to [email or web address]  Consent: (Patient) Christina Mann provided verbal consent for this virtual visit at the beginning of the encounter.  Current Medications:  Current Outpatient Medications:    cephALEXin  (KEFLEX ) 500 MG capsule, Take 1 capsule (500 mg total) by mouth 3 (three) times daily for 7 days., Disp: 21 capsule, Rfl: 0   Medications ordered in this encounter:  Meds ordered this encounter  Medications   cephALEXin  (KEFLEX ) 500 MG capsule    Sig: Take 1 capsule (500 mg total) by mouth 3 (three) times daily for 7 days.    Dispense:  21 capsule    Refill:  0    Supervising Provider:   BLAISE ALEENE KIDD [[id number]]     *If you need refills on other medications prior to your next appointment, please contact your pharmacy*  Follow-Up: Call back or seek an in-person evaluation if the symptoms worsen or if the condition fails to improve as anticipated.  [facility] [phone]  Other Instructions Take antibiotic as directed. Please keep skin clean and dry. Tylenol  or Ibuprofen  as needed. If you note any non-resolving, new, or worsening symptoms despite treatment, please seek an in-person evaluation ASAP.    If you have been instructed to have an in-person evaluation today at a local Urgent Care facility, please use the link below. It will take you to a list of all of our available [facility], including address, phone number and hours of operation.  Please do not delay care.  [facility]  If you or a family member do not have a primary care provider, use the link below to schedule a visit and establish care. When you choose a [facility] primary care physician or advanced practice provider, you gain a long-term partner in health. Find a Primary Care Provider  Learn more about 's in-office and virtual care options:  - Get Care Now

## 2023-11-04 ENCOUNTER — Telehealth: Admitting: Physician Assistant

## 2023-11-04 DIAGNOSIS — J4521 Mild intermittent asthma with (acute) exacerbation: Secondary | ICD-10-CM | POA: Diagnosis not present

## 2023-11-06 ENCOUNTER — Other Ambulatory Visit: Payer: Self-pay | Admitting: Medical Genetics

## 2023-11-06 DIAGNOSIS — Z006 Encounter for examination for normal comparison and control in clinical research program: Secondary | ICD-10-CM

## 2023-11-18 MED ORDER — PREDNISONE 20 MG PO TABS: 40.0000 mg | ORAL_TABLET | Freq: Every day | ORAL | 0 refills | Status: AC

## 2023-11-18 MED ORDER — ALBUTEROL SULFATE HFA 108 (90 BASE) MCG/ACT IN AERS: 2.0000 | INHALATION_SPRAY | Freq: Four times a day (QID) | RESPIRATORY_TRACT | 0 refills | Status: AC | PRN

## 2023-11-18 NOTE — Progress Notes (Signed)
 E Visit for Asthma  Based on what you have shared with me, it looks like you may have a flare up of your asthma.  Asthma is a chronic (ongoing) lung disease which results in airway obstruction, inflammation and hyper-responsiveness.   Asthma symptoms vary from person to person, with common symptoms including nighttime awakening and decreased ability to participate in normal activities due to shortness of breath. It is often triggered by changes in weather, changes in the season, changes in air temperature, or inside (home, school, daycare or work) allergens such as animal dander, mold, mildew, woodstoves or cockroaches.   It can also be triggered by hormonal changes, extreme emotion, physical exertion or an upper respiratory tract illness.     It is important to identify the trigger and eliminate or avoid the trigger if possible.   If you have been prescribed medications to be taken on a regular basis, it is important to follow the asthma action plan and to follow guidelines to adjust medication in response to increasing symptoms of decreased peak expiratory flow rate.  Treatment: I have prescribed: Albuterol  (Proventil  HFA; Ventolin  HFA) 108 (90 Base) MCG/ACT Inhaler 2 puffs into the lungs every six hours as needed for wheezing or shortness of breath and Prednisone 40mg  by mouth per day for 5 - 7 days  HOME CARE Only take medications as instructed by your medical team. Consider wearing a mask or scarf to improve breathing when there is poor air quality as this has been shown to decrease irritation and decrease exacerbations Get rest. Using a humidifier may help nasal congestion and ease sore throat pain. Using a saline nasal spray works much the same way.  Cough drops, hard candies and sore throat lozenges may ease your cough.  Avoid close contacts, especially the very you and the  elderly. Cover your mouth if you cough or sneeze. Always remember to wash your hands.   GET HELP RIGHT AWAY IF: You develop worsening shortness of breath/difficulty breathing or chest tightness, breathlessness at rest, drowsy, confused or agitated, unable to speak in full sentences, or if you develop chest pain.  You have coughing fits. You develop a severe headache or visual changes. You develop shortness of breath, difficulty breathing or start having chest pain. Your symptoms persist after you have completed your treatment plan. If your symptoms do not improve within 5 days.  MAKE SURE YOU Understand these instructions. Will watch your condition. Will get help right away if you are not doing well or get worse.   Your e-visit answers were reviewed by a board certified advanced clinical practitioner to complete your personal care plan, Depending upon the condition, your plan could have included both over the counter or prescription medications.  Please review your pharmacy choice. Your safety is important to us . If you have drug allergies check your prescription carefully. You can use MyChart to ask questions about today's visit, request a non-urgent call back, or ask for a work or school excuse for 24 hours related to this e-Visit. If it has been greater than 24 hours you will need to follow up with your provider, or enter a new e-Visit to address those concerns.  You will get an e-mail in the next two days asking about your experience. I hope that your e-visit has been valuable and will speed your recovery. Thank you for using e-visits.  I have spent 5 minutes in review of e-visit questionnaire, review and updating patient chart, medical decision making and response to  patient.   Elsie Velma Lunger, PA-C

## 2023-12-28 ENCOUNTER — Ambulatory Visit

## 2023-12-29 ENCOUNTER — Inpatient Hospital Stay: Admission: RE | Admit: 2023-12-29 | Discharge: 2023-12-29 | Attending: Internal Medicine | Admitting: Internal Medicine

## 2023-12-29 ENCOUNTER — Other Ambulatory Visit: Payer: Self-pay

## 2023-12-29 VITALS — BP 128/86 | HR 102 | Temp 98.3°F | Resp 18

## 2023-12-29 DIAGNOSIS — Z113 Encounter for screening for infections with a predominantly sexual mode of transmission: Secondary | ICD-10-CM | POA: Insufficient documentation

## 2023-12-29 DIAGNOSIS — R300 Dysuria: Secondary | ICD-10-CM | POA: Diagnosis present

## 2023-12-29 DIAGNOSIS — N898 Other specified noninflammatory disorders of vagina: Secondary | ICD-10-CM | POA: Insufficient documentation

## 2023-12-29 DIAGNOSIS — R350 Frequency of micturition: Secondary | ICD-10-CM | POA: Insufficient documentation

## 2023-12-29 LAB — POCT URINE DIPSTICK
Bilirubin, UA: NEGATIVE
Blood, UA: NEGATIVE
Glucose, UA: NEGATIVE mg/dL
Ketones, POC UA: NEGATIVE mg/dL
Leukocytes, UA: NEGATIVE
Nitrite, UA: NEGATIVE
Protein Ur, POC: NEGATIVE mg/dL
Spec Grav, UA: 1.025 (ref 1.010–1.025)
Urobilinogen, UA: 2 U/dL — AB
pH, UA: 6.5 (ref 5.0–8.0)

## 2023-12-29 LAB — POCT URINE PREGNANCY: Preg Test, Ur: NEGATIVE

## 2023-12-29 NOTE — ED Provider Notes (Signed)
 [facility]    CSN: [id number] Arrival date & time: 12/29/23  1054      History   Chief Complaint Chief Complaint  Patient presents with   SEXUALLY TRANSMITTED DISEASE    STD TESTUTI TESTBV TEST - Entered by patient    HPI Christina Mann is a 28 y.o. female.   Patient presents with 3-day history of urinary frequency, dysuria, clear vaginal discharge.  Denies exposure to STD but patient has had unprotected sexual intercourse recently.  Denies fever, chills, back pain.  Last menstrual cycle was at the end of November.     Past Medical History:  Diagnosis Date   Medical history non-contributory     There are no active problems to display for this patient.   Past Surgical History:  Procedure Laterality Date   NO PAST SURGERIES      OB History     Gravida  1   Para  1   Term  1   Preterm      AB      Living  1      SAB      IAB      Ectopic      Multiple  0   Live Births  1            Home Medications    Prior to Admission medications   Medication Sig Start Date End Date Taking? Authorizing Provider  albuterol  (VENTOLIN  HFA) 108 (90 Base) MCG/ACT inhaler Inhale 2 puffs into the lungs every 6 (six) hours as needed for wheezing or shortness of breath. 11/18/23   Gladis Elsie BROCKS, PA-C  predniSONE  (DELTASONE ) 20 MG tablet Take 2 tablets (40 mg total) by mouth daily with breakfast. 11/18/23   Gladis Elsie BROCKS, PA-C    Family History History reviewed. No pertinent family history.  Social History Social History   Tobacco Use   Smoking status: Never   Smokeless tobacco: Never  Vaping Use   Vaping status: Never Used  Substance Use Topics   Alcohol use: Yes    Comment: socially   Drug use: Never     Allergies   Patient has no known allergies.   Review of Systems Review of Systems Per HPI  Physical Exam Triage Vital Signs ED Triage Vitals [12/29/23 1119]  Encounter Vitals Group     BP 128/86     Girls  Systolic BP Percentile      Girls Diastolic BP Percentile      Boys Systolic BP Percentile      Boys Diastolic BP Percentile      Pulse Rate (!) 102     Resp 18     Temp 98.3 F (36.8 C)     Temp Source Oral     SpO2 97 %     Weight      Height      Head Circumference      Peak Flow      Pain Score 3     Pain Loc      Pain Education      Exclude from Growth Chart    No data found.  Updated Vital Signs BP 128/86 (BP Location: Left Arm)   Pulse (!) 102   Temp 98.3 F (36.8 C) (Oral)   Resp 18   LMP 12/21/2023   SpO2 97%   Visual Acuity Right Eye Distance:   Left Eye Distance:   Bilateral Distance:    Right Eye Near:  Left Eye Near:    Bilateral Near:     Physical Exam Constitutional:      General: She is not in acute distress.    Appearance: Normal appearance. She is not toxic-appearing or diaphoretic.  HENT:     Head: Normocephalic and atraumatic.  Eyes:     Extraocular Movements: Extraocular movements intact.     Conjunctiva/sclera: Conjunctivae normal.  Pulmonary:     Effort: Pulmonary effort is normal.  Genitourinary:    Comments: Deferred with shared decision making.  Self swab performed. Neurological:     General: No focal deficit present.     Mental Status: She is alert and oriented to person, place, and time. Mental status is at baseline.  Psychiatric:        Mood and Affect: Mood normal.        Behavior: Behavior normal.        Thought Content: Thought content normal.        Judgment: Judgment normal.      UC Treatments / Results  Labs (all labs ordered are listed, but only abnormal results are displayed) Labs Reviewed  POCT URINE DIPSTICK - Abnormal; Notable for the following components:      Result Value   Clarity, UA cloudy (*)    Urobilinogen, UA 2.0 (*)    All other components within normal limits  POCT URINE PREGNANCY - Normal  URINE CULTURE  CERVICOVAGINAL ANCILLARY ONLY    EKG   Radiology No results  found.  Procedures Procedures (including critical care time)  Medications Ordered in UC Medications - No data to display  Initial Impression / Assessment and Plan / UC Course  I have reviewed the triage vital signs and the nursing notes.  Pertinent labs & imaging results that were available during my care of the patient were reviewed by me and considered in my medical decision making (see chart for details).     UA unremarkable but will send urine culture to confirm.  Cervicovaginal swab pending.  Awaiting results prior to treatment given no confirmed exposure to STD.  Urine pregnancy completed per protocol by clinical staff prior to provider evaluation but this was most likely not necessary given recent menstrual cycle. Advised strict follow-up precautions.  Patient verbalized understanding and was agreeable with plan. Final Clinical Impressions(s) / UC Diagnoses   Final diagnoses:  Dysuria  Urinary frequency  Vaginal discharge  Screening examination for venereal disease     Discharge Instructions      Vaginal swab and urine culture are pending.  We will call when they result and send any appropriate treatment.  Please refrain from sexual activity until test results and treatment are complete.  Follow-up if any symptoms persist or worsen.    ED Prescriptions   None    PDMP not reviewed this encounter.   Hazen Darryle BRAVO, [place] 12/29/23 1146

## 2023-12-29 NOTE — Discharge Instructions (Signed)
 Vaginal swab and urine culture are pending.  We will call when they result and send any appropriate treatment.  Please refrain from sexual activity until test results and treatment are complete.  Follow-up if any symptoms persist or worsen.

## 2023-12-29 NOTE — ED Triage Notes (Signed)
 Pt here for urinary frequency and burning as well as requesting STD testing

## 2023-12-30 ENCOUNTER — Ambulatory Visit (HOSPITAL_COMMUNITY): Payer: Self-pay

## 2023-12-30 ENCOUNTER — Ambulatory Visit

## 2023-12-30 LAB — URINE CULTURE: Culture: NO GROWTH

## 2023-12-30 LAB — CERVICOVAGINAL ANCILLARY ONLY
Bacterial Vaginitis (gardnerella): POSITIVE — AB
Candida Glabrata: NEGATIVE
Candida Vaginitis: NEGATIVE
Chlamydia: NEGATIVE
Comment: NEGATIVE
Comment: NEGATIVE
Comment: NEGATIVE
Comment: NEGATIVE
Comment: NEGATIVE
Comment: NORMAL
Neisseria Gonorrhea: NEGATIVE
Trichomonas: NEGATIVE

## 2023-12-30 MED ORDER — METRONIDAZOLE 500 MG PO TABS: 500.0000 mg | ORAL_TABLET | Freq: Two times a day (BID) | ORAL | 0 refills | Status: AC

## 2024-01-07 ENCOUNTER — Telehealth: Admitting: Physician Assistant

## 2024-01-07 DIAGNOSIS — N760 Acute vaginitis: Secondary | ICD-10-CM

## 2024-01-07 NOTE — Progress Notes (Signed)
° °  Thank you for the details you included in the comment boxes. Those details are very helpful in determining the best course of treatment for you and help us  to provide the best care.Because of persistent symptoms despite recent treatment, we recommend that you schedule a Virtual Urgent Care video visit in order for the provider to better assess what is going on.  The provider will be able to give you a more accurate diagnosis and treatment plan if we can more freely discuss your symptoms and with the addition of a virtual examination.   If you change your visit to a video visit, we will bill your insurance (similar to an office visit) and you will not be charged for this e-Visit. You will be able to stay at home and speak with the first available [facility] advanced practice provider. The link to do a video visit is in the drop down Menu tab of your Welcome screen in MyChart.

## 2024-01-09 ENCOUNTER — Telehealth: Admitting: Family Medicine

## 2024-01-09 DIAGNOSIS — N760 Acute vaginitis: Secondary | ICD-10-CM

## 2024-01-09 DIAGNOSIS — B9689 Other specified bacterial agents as the cause of diseases classified elsewhere: Secondary | ICD-10-CM

## 2024-01-09 MED ORDER — METRONIDAZOLE 0.75 % VA GEL: 1.0000 | Freq: Every day | VAGINAL | 0 refills | Status: AC

## 2024-01-09 NOTE — Progress Notes (Signed)
 Virtual Visit Consent   [name], you are scheduled for a virtual visit with a [facility] provider today. Just as with appointments in the office, your consent must be obtained to participate. Your consent will be active for this visit and any virtual visit you may have with one of our providers in the next 365 days. If you have a MyChart account, a copy of this consent can be sent to you electronically.  As this is a virtual visit, video technology does not allow for your provider to perform a traditional examination. This may limit your provider's ability to fully assess your condition. If your provider identifies any concerns that need to be evaluated in person or the need to arrange testing (such as labs, EKG, etc.), we will make arrangements to do so. Although advances in technology are sophisticated, we cannot ensure that it will always work on either your end or our end. If the connection with a video visit is poor, the visit may have to be switched to a telephone visit. With either a video or telephone visit, we are not always able to ensure that we have a secure connection.  By engaging in this virtual visit, you consent to the provision of healthcare and authorize for your insurance to be billed (if applicable) for the services provided during this visit. Depending on your insurance coverage, you may receive a charge related to this service.  I need to obtain your verbal consent now. Are you willing to proceed with your visit today? Christina Mann has provided verbal consent on 01/09/2024 for a virtual visit (video or telephone). Christina Mann, [place]  Date: 01/09/2024 2:15 PM   Virtual Visit via Video Note   Christina Mann, connected with  BREASIA KARGES  ([id number], 09-27-1995) on 01/09/2024 at  2:00 PM EST by a video-enabled telemedicine application and verified that I am speaking with the correct person using two identifiers.  Location: Patient: Virtual Visit  Location Patient: Home Provider: Virtual Visit Location Provider: Home Office   I discussed the limitations of evaluation and management by telemedicine and the availability of in person appointments. The patient expressed understanding and agreed to proceed.    History of Present Illness: Christina Mann is a 28 y.o. who identifies as a female who was assigned female at birth, and is being seen today for c/o recently going to urgent care and testing positive for BV and took the medication for 7 days.  Pt states her last treatment was on Thursday but she feels like she still has the symptoms. Pt is requesting the vaginal gel.   HPI: HPI  Problems: There are no active problems to display for this patient.   Allergies: Allergies[1] Medications: Current Medications[2]  Observations/Objective: Patient is well-developed, well-nourished in no acute distress.  Resting comfortably at home.  Head is normocephalic, atraumatic.  No labored breathing.  Speech is clear and coherent with logical content.  Patient is alert and oriented at baseline.    Assessment and Plan: 1. Bacterial vaginosis (Primary) - metroNIDAZOLE  (METROGEL ) 0.75 % vaginal gel; Place 1 Applicatorful vaginally at bedtime for 5 days.  Dispense: 50 g; Refill: 0  -Pt advised to follow up with PCP or urgent care for worsening or recurrent symptoms.   Follow Up Instructions: I discussed the assessment and treatment plan with the patient. The patient was provided an opportunity to ask questions and all were answered. The patient agreed with the plan and demonstrated an understanding of  the instructions.  A copy of instructions were sent to the patient via MyChart unless otherwise noted below.    The patient was advised to call back or seek an in-person evaluation if the symptoms worsen or if the condition fails to improve as anticipated.    Christina Mater, PA-C    [1] No Known Allergies [2]  Current Outpatient Medications:     metroNIDAZOLE  (METROGEL ) 0.75 % vaginal gel, Place 1 Applicatorful vaginally at bedtime for 5 days., Disp: 50 g, Rfl: 0   albuterol  (VENTOLIN  HFA) 108 (90 Base) MCG/ACT inhaler, Inhale 2 puffs into the lungs every 6 (six) hours as needed for wheezing or shortness of breath., Disp: 8 g, Rfl: 0   predniSONE  (DELTASONE ) 20 MG tablet, Take 2 tablets (40 mg total) by mouth daily with breakfast., Disp: 10 tablet, Rfl: 0

## 2024-01-09 NOTE — Patient Instructions (Signed)
 Marien JONELLE Macadam, thank you for joining Roosvelt Mater, PA-C for today's virtual visit.  While this provider is not your primary care provider (PCP), if your PCP is located in our provider database this encounter information will be shared with them immediately following your visit.   A [facility] MyChart account gives you access to today's visit and all your visits, tests, and labs performed at [facility]  click here if you don't have a [facility] MyChart account or go to [email or web address]  Consent: (Patient) Yanessa R Castellanos provided verbal consent for this virtual visit at the beginning of the encounter.  Current Medications:  Current Outpatient Medications:    metroNIDAZOLE  (METROGEL ) 0.75 % vaginal gel, Place 1 Applicatorful vaginally at bedtime for 5 days., Disp: 50 g, Rfl: 0   albuterol  (VENTOLIN  HFA) 108 (90 Base) MCG/ACT inhaler, Inhale 2 puffs into the lungs every 6 (six) hours as needed for wheezing or shortness of breath., Disp: 8 g, Rfl: 0   predniSONE  (DELTASONE ) 20 MG tablet, Take 2 tablets (40 mg total) by mouth daily with breakfast., Disp: 10 tablet, Rfl: 0   Medications ordered in this encounter:  Meds ordered this encounter  Medications   metroNIDAZOLE  (METROGEL ) 0.75 % vaginal gel    Sig: Place 1 Applicatorful vaginally at bedtime for 5 days.    Dispense:  50 g    Refill:  0     *If you need refills on other medications prior to your next appointment, please contact your pharmacy*  Follow-Up: Call back or seek an in-person evaluation if the symptoms worsen or if the condition fails to improve as anticipated.  [facility] [phone]  Other Instructions Vaginal Infection (Bacterial Vaginosis): What to Know  Bacterial vaginosis is an infection of the vagina. It happens when the balance of normal germs (bacteria) in the vagina changes. It's common among females ages 33 to 81. If left untreated, it can increase your risk of  getting a sexually transmitted infection (STI). If you're pregnant, you need to get treated right away. This infection can cause a baby to be born early or at a low birth weight. What are the causes? This happens when too many harmful germs grow in the vagina. The exact reason why this happens isn't known. You can't get this infection from toilet seats, bedding, swimming pools, or contact with objects around you. What increases the risk? Having new or multiple sexual partners, or unprotected sex. Douching. Using an intrauterine device (IUD). Smoking. Alcohol and drug abuse. Taking certain antibiotics. Being pregnant. You can get a vaginal infection without being sexually active. However, it most often occurs in sexually active females. What are the signs or symptoms? Some females have no symptoms. If you have symptoms, they may include: [place] or white vaginal discharge. It can be watery or foamy. A fish-like smell, especially after sex or during your menstrual period. Itching in and around the vagina. Burning or pain with peeing. How is this diagnosed? This infection is diagnosed based on: Your medical history. A physical exam of the vagina. Checking a sample of vaginal fluid for harmful bacteria or uncommon cells. How is this treated? This condition is treated with antibiotics. These may be given as: A pill. A cream for your vagina. A medicine that you put into your vagina called a suppository. If the infection comes back, you may need more antibiotics. Follow these instructions at home: Medicines Take your medicines only as told. Take or apply your antibiotics  as told. Do not stop using them even if you start to feel better. General instructions If you have a female sexual partner, tell her about the infection. She should see her health care provider. Female partners don't need treatment. Avoid sex until treatment is complete. Drink more fluids as told. Keep the area around your  vagina and rectum clean. Wash the area daily with warm water. Wipe yourself from front to back after pooping. If you're breastfeeding, talk to your provider about continuing during treatment. How is this prevented? Self-care Do not douche or use vaginal deodorant sprays. Douching can upset the balance of good and harmful bacteria in the vagina, which can cause an infection to happen again. Wear cotton or cotton-lined underwear. Avoid wearing tight pants or pantyhose, especially in the summer. Safe sex Use condoms correctly and every time you have sex. Use dental dams to protect yourself during oral sex. Limit the number of sexual partners. Get tested for STIs. Your sexual partner should also get tested. Drugs and alcohol Do not smoke, vape, or use nicotine or tobacco. Do not use drugs. Limit the amount of alcohol you drink because it can lead to risky sexual behavior. Where to find more information To learn more: Go to tonerpromos.no. Click Health Topics A-Z. Type bacterial vaginosis in the search box. American Sexual Health Association (ASHA): ashasexualhealth.org U.S. Department of Health and Health And Safety Inspector, Office on Women's Health: travellesson.ca Contact a health care provider if: Your symptoms don't get better, even after treatment. You have more discharge or pain when peeing. You have a fever or chills. You have pain in your belly or pelvis. You have pain during sex. You have vaginal bleeding between menstrual periods. This information is not intended to replace advice given to you by your health care provider. Make sure you discuss any questions you have with your health care provider. Document Revised: 07/02/2022 Document Reviewed: 07/02/2022 Elsevier Patient Education  2024 Elsevier Inc.   If you have been instructed to have an in-person evaluation today at a local Urgent Care facility, please use the link below. It will take you to a list of all of our available [facility], including address, phone number and hours of operation. Please do not delay care.  [facility]  If you or a family member do not have a primary care provider, use the link below to schedule a visit and establish care. When you choose a [facility] primary care physician or advanced practice provider, you gain a long-term partner in health. Find a Primary Care Provider  Learn more about [facility]'s in-office and virtual care options:  - Get Care Now

## 2024-02-01 ENCOUNTER — Ambulatory Visit

## 2024-02-03 ENCOUNTER — Ambulatory Visit

## 2024-02-15 ENCOUNTER — Ambulatory Visit

## 2024-02-17 ENCOUNTER — Ambulatory Visit: Admission: EM | Admit: 2024-02-17 | Discharge: 2024-02-17 | Disposition: A | Discharge status: Home / Self Care

## 2024-02-17 ENCOUNTER — Encounter: Payer: Self-pay | Admitting: Emergency Medicine

## 2024-02-17 ENCOUNTER — Ambulatory Visit

## 2024-02-17 DIAGNOSIS — N898 Other specified noninflammatory disorders of vagina: Secondary | ICD-10-CM | POA: Insufficient documentation

## 2024-02-17 LAB — POCT URINE PREGNANCY: Preg Test, Ur: NEGATIVE

## 2024-02-17 NOTE — ED Provider Notes (Signed)
 " [facility]  Note:  This document was prepared using Dragon voice recognition software and may include unintentional dictation errors.  MRN: [id number] DOB: 1995/05/21  Subjective:   Christina Mann is a 29 y.o. female presenting for abdominal pain, vaginal irritation, vaginal discharge x 45 days.  Patient reports that she is previously diagnosed with BV approximately a month ago was prescribed medication to treat symptoms but symptoms never fully resolved.  Patient reports that she had a virtual visit and they prescribed her some creams to try but symptoms did not resolve with prescribed therapy.  Patient denies any dysuria, increased urinary frequency, flank pain, fever.  Current Medications[1]   Allergies[2]  Past Medical History:  Diagnosis Date   Medical history non-contributory      Past Surgical History:  Procedure Laterality Date   NO PAST SURGERIES      History reviewed. No pertinent family history.  Social History[3]  ROS Refer to HPI for ROS details.  Objective:   Vitals: BP 120/89 (BP Location: Left Arm)   Pulse 100   Temp 99.3 F (37.4 C) (Oral)   Resp 18   Wt 289 lb 14.5 oz (131.5 kg)   LMP 02/14/2024 (Approximate)   SpO2 98%   BMI 45.41 kg/m   Physical Exam Vitals and nursing note reviewed.  Constitutional:      General: She is not in acute distress.    Appearance: Normal appearance. She is well-developed. She is not ill-appearing or toxic-appearing.  HENT:     Head: Normocephalic and atraumatic.  Cardiovascular:     Rate and Rhythm: Normal rate.  Pulmonary:     Effort: Pulmonary effort is normal. No respiratory distress.  Abdominal:     General: There is no distension.     Tenderness: There is abdominal tenderness. There is no right CVA tenderness or left CVA tenderness.  Genitourinary:    Vagina: Vaginal discharge present.  Skin:    General: Skin is warm and dry.  Neurological:     General: No focal deficit present.      Mental Status: She is alert and oriented to person, place, and time.  Psychiatric:        Mood and Affect: Mood normal.        Behavior: Behavior normal.     Procedures  Results for orders placed or performed during the hospital encounter of 02/17/24 (from the past 24 hours)  POCT urine pregnancy     Status: Normal   Collection Time: 02/17/24  3:12 PM  Result Value Ref Range   Preg Test, Ur Negative Negative    No results found.   Assessment and Plan :     Discharge Instructions       1. Vaginal discharge (Primary) - POCT urine pregnancy completed today in UC is negative for pregnancy - Cervicovaginal swab collected today in UC and sent to lab for further testing results to be available in 2 to 3 days. - Ambulatory referral to Gynecology for follow-up evaluation and treatment should symptoms persist despite treatment.  -Continue to monitor symptoms for any change in severity if there is any escalation of current symptoms or development of new symptoms follow-up in ER for further evaluation and management.       Earmon Sherrow B Chole Driver    [1] No current facility-administered medications for this encounter.  Current Outpatient Medications:    albuterol  (VENTOLIN  HFA) 108 (90 Base) MCG/ACT inhaler, Inhale 2 puffs into the lungs every 6 (six) hours  as needed for wheezing or shortness of breath., Disp: 8 g, Rfl: 0   predniSONE  (DELTASONE ) 20 MG tablet, Take 2 tablets (40 mg total) by mouth daily with breakfast., Disp: 10 tablet, Rfl: 0 [2] No Known Allergies [3]  Social History Tobacco Use   Smoking status: Never   Smokeless tobacco: Never  Vaping Use   Vaping status: Never Used  Substance Use Topics   Alcohol use: Yes    Comment: socially   Drug use: Never     Aurea Goodell B, NP 02/17/24 1550  "

## 2024-02-17 NOTE — ED Triage Notes (Signed)
 Pt presents c/o abd pain, vaginal irritation, and discharge x 45+ days. Pt states,  About a month ago I tested positive for BV. I took the meds but they didn't fully cure it. So, I did a virtual visit and was given some creams but they didn't really help either.

## 2024-02-17 NOTE — Discharge Instructions (Addendum)
" °  1. Vaginal discharge (Primary) - POCT urine pregnancy completed today in UC is negative for pregnancy - Cervicovaginal swab collected today in UC and sent to lab for further testing results to be available in 2 to 3 days. - Ambulatory referral to Gynecology for follow-up evaluation and treatment should symptoms persist despite treatment.  -Continue to monitor symptoms for any change in severity if there is any escalation of current symptoms or development of new symptoms follow-up in ER for further evaluation and management. "

## 2024-02-18 LAB — CERVICOVAGINAL ANCILLARY ONLY
Bacterial Vaginitis (gardnerella): NEGATIVE
Candida Glabrata: NEGATIVE
Candida Vaginitis: NEGATIVE
Chlamydia: NEGATIVE
Comment: NEGATIVE
Comment: NEGATIVE
Comment: NEGATIVE
Comment: NEGATIVE
Comment: NEGATIVE
Comment: NORMAL
Neisseria Gonorrhea: POSITIVE — AB
Trichomonas: NEGATIVE

## 2024-02-19 ENCOUNTER — Ambulatory Visit (HOSPITAL_COMMUNITY): Payer: Self-pay

## 2024-02-20 ENCOUNTER — Ambulatory Visit: Payer: Self-pay

## 2024-02-22 ENCOUNTER — Ambulatory Visit

## 2024-02-23 ENCOUNTER — Encounter: Payer: Self-pay | Admitting: Emergency Medicine

## 2024-02-23 ENCOUNTER — Ambulatory Visit
Admission: EM | Admit: 2024-02-23 | Discharge: 2024-02-23 | Disposition: A | Discharge status: Home / Self Care | Attending: Physician Assistant | Admitting: Physician Assistant

## 2024-02-23 MED ORDER — CEFTRIAXONE SODIUM 500 MG IJ SOLR
500.0000 mg | INTRAMUSCULAR | Status: DC
  Administered 2024-02-23: 500 mg via INTRAMUSCULAR

## 2024-02-23 NOTE — ED Triage Notes (Signed)
 Patient test positive for gonorrhea and is her for medication

## 2024-02-24 ENCOUNTER — Ambulatory Visit
